# Patient Record
Sex: Male | Born: 1955
Health system: Southern US, Community
[De-identification: ages and names within clinical notes are randomized; demographics above are authoritative.]

## PROBLEM LIST (undated history)

## (undated) DIAGNOSIS — I10 Essential (primary) hypertension: Secondary | ICD-10-CM

## (undated) DIAGNOSIS — E785 Hyperlipidemia, unspecified: Secondary | ICD-10-CM

## (undated) DIAGNOSIS — I2510 Atherosclerotic heart disease of native coronary artery without angina pectoris: Secondary | ICD-10-CM

## (undated) HISTORY — PX: CARDIAC SURGERY: SHX584

## (undated) HISTORY — PX: CHOLECYSTECTOMY: SHX55

## (undated) HISTORY — PX: CORONARY ARTERY BYPASS GRAFT: SHX141

---

## 2004-03-12 HISTORY — PX: COLONOSCOPY: SHX5424

## 2004-04-19 ENCOUNTER — Ambulatory Visit: Payer: Self-pay | Admitting: Gastroenterology

## 2004-05-30 ENCOUNTER — Ambulatory Visit: Payer: Self-pay | Admitting: Gastroenterology

## 2007-02-23 ENCOUNTER — Emergency Department: Payer: Self-pay | Admitting: Emergency Medicine

## 2007-02-24 ENCOUNTER — Other Ambulatory Visit: Payer: Self-pay

## 2008-09-20 ENCOUNTER — Ambulatory Visit: Payer: Self-pay | Admitting: Unknown Physician Specialty

## 2008-09-30 ENCOUNTER — Ambulatory Visit: Payer: Self-pay | Admitting: Surgery

## 2008-10-07 ENCOUNTER — Ambulatory Visit: Payer: Self-pay | Admitting: Surgery

## 2012-03-17 ENCOUNTER — Ambulatory Visit: Payer: Self-pay | Admitting: Family Medicine

## 2013-10-14 DIAGNOSIS — E785 Hyperlipidemia, unspecified: Secondary | ICD-10-CM | POA: Insufficient documentation

## 2013-10-14 DIAGNOSIS — I25810 Atherosclerosis of coronary artery bypass graft(s) without angina pectoris: Secondary | ICD-10-CM | POA: Insufficient documentation

## 2013-10-14 DIAGNOSIS — I10 Essential (primary) hypertension: Secondary | ICD-10-CM | POA: Insufficient documentation

## 2013-11-06 DIAGNOSIS — Z951 Presence of aortocoronary bypass graft: Secondary | ICD-10-CM | POA: Insufficient documentation

## 2014-11-30 ENCOUNTER — Ambulatory Visit (INDEPENDENT_AMBULATORY_CARE_PROVIDER_SITE_OTHER): Payer: 59 | Admitting: Family Medicine

## 2014-11-30 ENCOUNTER — Encounter: Payer: Self-pay | Admitting: Family Medicine

## 2014-11-30 VITALS — BP 120/88 | HR 72 | Ht 73.0 in | Wt 243.0 lb

## 2014-11-30 DIAGNOSIS — Z0000 Encounter for general adult medical examination without abnormal findings: Secondary | ICD-10-CM | POA: Diagnosis not present

## 2014-11-30 DIAGNOSIS — Z951 Presence of aortocoronary bypass graft: Secondary | ICD-10-CM

## 2014-11-30 DIAGNOSIS — E785 Hyperlipidemia, unspecified: Secondary | ICD-10-CM

## 2014-11-30 DIAGNOSIS — I25810 Atherosclerosis of coronary artery bypass graft(s) without angina pectoris: Secondary | ICD-10-CM

## 2014-11-30 DIAGNOSIS — Z23 Encounter for immunization: Secondary | ICD-10-CM

## 2014-11-30 DIAGNOSIS — Z125 Encounter for screening for malignant neoplasm of prostate: Secondary | ICD-10-CM

## 2014-11-30 DIAGNOSIS — I10 Essential (primary) hypertension: Secondary | ICD-10-CM | POA: Diagnosis not present

## 2014-11-30 DIAGNOSIS — Z1211 Encounter for screening for malignant neoplasm of colon: Secondary | ICD-10-CM | POA: Diagnosis not present

## 2014-11-30 LAB — HEMOCCULT GUIAC POC 1CARD (OFFICE): FECAL OCCULT BLD: NEGATIVE

## 2014-11-30 NOTE — Progress Notes (Signed)
Name: Dustin Arnold   MRN: [id number]    DOB: 1955/11/13   Date:11/30/2014       Progress Note  Subjective  Chief Complaint  Chief Complaint  Patient presents with  . Annual Exam    needs labs    HPI Comments: Annual physical exam with no subjective/objective concerns.  Hypertension This is a chronic problem. The current episode started more than 1 year ago. The problem has been gradually improving since onset. Pertinent negatives include no anxiety, blurred vision, chest pain, headaches, malaise/fatigue, neck pain, orthopnea, palpitations, peripheral edema, PND, shortness of breath or sweats. There are no associated agents to hypertension. Risk factors for coronary artery disease include dyslipidemia and male gender. Past treatments include beta blockers. The current treatment provides moderate improvement. There are no compliance problems.  There is no history of angina, kidney disease, CAD/MI, CVA, heart failure, left ventricular hypertrophy, PVD, renovascular disease or retinopathy. There is no history of chronic renal disease.  Hyperlipidemia This is a recurrent problem. The current episode started more than 1 month ago. He has no history of chronic renal disease, diabetes, hypothyroidism, liver disease, obesity or nephrotic syndrome. Pertinent negatives include no chest pain, focal sensory loss, focal weakness, leg pain, myalgias or shortness of breath. Current antihyperlipidemic treatment includes statins. The current treatment provides mild improvement of lipids. There are no compliance problems.     No problem-specific assessment & plan notes found for this encounter.   No past medical history on file.  Past Surgical History  Procedure Laterality Date  . Cardiac surgery      bypass  . Cholecystectomy    . Colonoscopy  2006    cleared for [facility]    No family history on file.  Social History   Social History  . Marital Status: Married    Spouse Name: N/A  . Number of  Children: N/A  . Years of Education: N/A   Occupational History  . Not on file.   Social History Main Topics  . Smoking status: Never Smoker   . Smokeless tobacco: Not on file  . Alcohol Use: 0.0 oz/week    0 Standard drinks or equivalent per week  . Drug Use: No  . Sexual Activity: Not on file   Other Topics Concern  . Not on file   Social History Narrative  . No narrative on file    Allergies  Allergen Reactions  . Other Other (See Comments)    IVP dye     Review of Systems  Constitutional: Negative for fever, chills, weight loss and malaise/fatigue.  HENT: Negative for ear discharge, ear pain and sore throat.   Eyes: Negative for blurred vision.  Respiratory: Negative for cough, sputum production, shortness of breath and wheezing.   Cardiovascular: Negative for chest pain, palpitations, orthopnea, leg swelling and PND.  Gastrointestinal: Negative for heartburn, nausea, abdominal pain, diarrhea, constipation, blood in stool and melena.  Genitourinary: Negative for dysuria, urgency, frequency and hematuria.  Musculoskeletal: Negative for myalgias, back pain, joint pain and neck pain.  Skin: Negative for rash.  Neurological: Negative for dizziness, tingling, sensory change, focal weakness and headaches.  Endo/Heme/Allergies: Negative for environmental allergies and polydipsia. Does not bruise/bleed easily.  Psychiatric/Behavioral: Negative for depression and suicidal ideas. The patient is not nervous/anxious and does not have insomnia.      Objective  Filed Vitals:   11/30/14 0845  BP: 120/88  Pulse: 72  Height:  (1.854 m)  Weight: 243 lb (110.224 kg)  Physical Exam  Constitutional: He is oriented to person, place, and time and well-developed, well-nourished, and in no distress.  HENT:  Head: Normocephalic.  Right Ear: External ear normal.  Left Ear: External ear normal.  Nose: Nose normal.  Mouth/Throat: Oropharynx is clear and moist.  Eyes:  Conjunctivae and EOM are normal. Pupils are equal, round, and reactive to light. Right eye exhibits no discharge. Left eye exhibits no discharge. No scleral icterus.  Neck: Normal range of motion. Neck supple. No JVD present. No tracheal deviation present. No thyromegaly present.  Cardiovascular: Normal rate, regular rhythm, normal heart sounds and intact distal pulses.  Exam reveals no gallop and no friction rub.   No murmur heard. Pulmonary/Chest: Breath sounds normal. No respiratory distress. He has no wheezes. He has no rales.  Abdominal: Soft. Bowel sounds are normal. He exhibits no mass. There is no hepatosplenomegaly. There is no tenderness. There is no rebound, no guarding and no CVA tenderness.  Genitourinary: Rectum normal, prostate normal and testes/scrotum normal. Guaiac negative stool.  Musculoskeletal: Normal range of motion. He exhibits no edema or tenderness.  Lymphadenopathy:    He has no cervical adenopathy.  Neurological: He is alert and oriented to person, place, and time. He has normal sensation, normal strength, normal reflexes and intact cranial nerves. No cranial nerve deficit.  Skin: Skin is warm. No rash noted.  Psychiatric: Mood and affect normal.      Assessment & Plan  Problem List Items Addressed This Visit      Cardiovascular and Mediastinum   Arteriosclerosis of autologous vein coronary artery bypass graft   Relevant Medications   aspirin 325 MG tablet   atorvastatin (LIPITOR) 80 MG tablet   metoprolol succinate (TOPROL-XL) 50 MG 24 hr tablet   Other Relevant Orders   Lipid Profile   Benign essential HTN   Relevant Medications   aspirin 325 MG tablet   atorvastatin (LIPITOR) 80 MG tablet   metoprolol succinate (TOPROL-XL) 50 MG 24 hr tablet   Other Relevant Orders   Renal Function Panel     Other   HLD (hyperlipidemia)   Relevant Medications   aspirin 325 MG tablet   atorvastatin (LIPITOR) 80 MG tablet   metoprolol succinate (TOPROL-XL) 50 MG  24 hr tablet   Other Relevant Orders   Lipid Profile   H/O coronary artery bypass surgery   Relevant Orders   Renal Function Panel   Prostate cancer screening   Relevant Orders   PSA    Other Visit Diagnoses    Annual physical exam    -  Primary    Relevant Orders    POCT Occult Blood Stool (Completed)    Colon cancer screening        Relevant Orders    Ambulatory referral to Gastroenterology    Need for pneumococcal vaccination        Relevant Orders    Pneumococcal polysaccharide vaccine 23-valent greater than or equal to 2yo subcutaneous/IM (Completed)    Need for influenza vaccination        Relevant Orders    Flu Vaccine QUAD 36+ mos PF IM (Fluarix & Fluzone Quad PF) (Completed)         Dr. Hayden [facility]  11/30/2014

## 2014-12-01 LAB — LIPID PANEL
CHOLESTEROL TOTAL: 150 mg/dL (ref 100–199)
Chol/HDL Ratio: 2.7 ratio units (ref 0.0–5.0)
HDL: 56 mg/dL (ref >39)
LDL Calculated: 78 mg/dL (ref 0–99)
Triglycerides: 79 mg/dL (ref 0–149)
VLDL Cholesterol Cal: 16 mg/dL (ref 5–40)

## 2014-12-01 LAB — RENAL FUNCTION PANEL
ALBUMIN: 4.3 g/dL (ref 3.6–4.8)
BUN/Creatinine Ratio: 11 (ref 10–22)
BUN: 12 mg/dL (ref 8–27)
CALCIUM: 9.2 mg/dL (ref 8.6–10.2)
CHLORIDE: 100 mmol/L (ref 97–108)
CO2: 25 mmol/L (ref 18–29)
Creatinine, Ser: 1.05 mg/dL (ref 0.76–1.27)
GFR calc Af Amer: 89 mL/min/{1.73_m2} (ref >59)
GFR calc non Af Amer: 77 mL/min/{1.73_m2} (ref >59)
Glucose: 83 mg/dL (ref 65–99)
POTASSIUM: 4.7 mmol/L (ref 3.5–5.2)
Phosphorus: 3.3 mg/dL (ref 2.5–4.5)
Sodium: 141 mmol/L (ref 134–144)

## 2014-12-01 LAB — PSA: Prostate Specific Ag, Serum: 0.4 ng/mL (ref 0.0–4.0)

## 2015-01-06 ENCOUNTER — Encounter: Payer: Self-pay | Admitting: *Deleted

## 2015-01-07 ENCOUNTER — Ambulatory Visit: Payer: 59 | Admitting: Anesthesiology

## 2015-01-07 ENCOUNTER — Ambulatory Visit
Admission: RE | Admit: 2015-01-07 | Discharge: 2015-01-07 | Disposition: A | Discharge status: Home / Self Care | Payer: 59 | Source: Ambulatory Visit | Attending: Gastroenterology | Admitting: Gastroenterology

## 2015-01-07 ENCOUNTER — Encounter
Admission: RE | Disposition: A | Discharge status: Home / Self Care | Payer: Self-pay | Source: Ambulatory Visit | Attending: Gastroenterology

## 2015-01-07 ENCOUNTER — Encounter: Payer: Self-pay | Admitting: Anesthesiology

## 2015-01-07 DIAGNOSIS — Z8249 Family history of ischemic heart disease and other diseases of the circulatory system: Secondary | ICD-10-CM | POA: Insufficient documentation

## 2015-01-07 DIAGNOSIS — Z1211 Encounter for screening for malignant neoplasm of colon: Secondary | ICD-10-CM | POA: Insufficient documentation

## 2015-01-07 DIAGNOSIS — Z951 Presence of aortocoronary bypass graft: Secondary | ICD-10-CM | POA: Insufficient documentation

## 2015-01-07 DIAGNOSIS — I2510 Atherosclerotic heart disease of native coronary artery without angina pectoris: Secondary | ICD-10-CM | POA: Diagnosis not present

## 2015-01-07 DIAGNOSIS — Z7982 Long term (current) use of aspirin: Secondary | ICD-10-CM | POA: Insufficient documentation

## 2015-01-07 DIAGNOSIS — I10 Essential (primary) hypertension: Secondary | ICD-10-CM | POA: Insufficient documentation

## 2015-01-07 DIAGNOSIS — K5730 Diverticulosis of large intestine without perforation or abscess without bleeding: Secondary | ICD-10-CM | POA: Insufficient documentation

## 2015-01-07 DIAGNOSIS — Z79899 Other long term (current) drug therapy: Secondary | ICD-10-CM | POA: Insufficient documentation

## 2015-01-07 DIAGNOSIS — Z8371 Family history of colonic polyps: Secondary | ICD-10-CM | POA: Insufficient documentation

## 2015-01-07 DIAGNOSIS — E785 Hyperlipidemia, unspecified: Secondary | ICD-10-CM | POA: Diagnosis not present

## 2015-01-07 HISTORY — PX: COLONOSCOPY WITH PROPOFOL: SHX5780

## 2015-01-07 HISTORY — DX: Atherosclerotic heart disease of native coronary artery without angina pectoris: I25.10

## 2015-01-07 HISTORY — DX: Hyperlipidemia, unspecified: E78.5

## 2015-01-07 HISTORY — DX: Essential (primary) hypertension: I10

## 2015-01-07 SURGERY — COLONOSCOPY WITH PROPOFOL
Anesthesia: General

## 2015-01-07 MED ORDER — LIDOCAINE HCL (CARDIAC) 20 MG/ML IV SOLN
INTRAVENOUS | Status: DC | PRN
  Administered 2015-01-07: 20 mg via INTRAVENOUS

## 2015-01-07 MED ORDER — PROPOFOL 500 MG/50ML IV EMUL
INTRAVENOUS | Status: DC | PRN
Start: 2015-01-07 — End: 2015-01-07
  Administered 2015-01-07: 125 ug/kg/min via INTRAVENOUS

## 2015-01-07 MED ORDER — SODIUM CHLORIDE 0.9 % IV SOLN: INTRAVENOUS | Status: DC

## 2015-01-07 MED ORDER — PROPOFOL 10 MG/ML IV BOLUS
INTRAVENOUS | Status: DC | PRN
  Administered 2015-01-07: 30 mg via INTRAVENOUS
  Administered 2015-01-07: 50 mg via INTRAVENOUS
  Administered 2015-01-07: 120 mg via INTRAVENOUS
  Administered 2015-01-07: 50 mg via INTRAVENOUS

## 2015-01-07 MED ORDER — SODIUM CHLORIDE 0.9 % IV SOLN
INTRAVENOUS | Status: DC
  Administered 2015-01-07: 1000 mL via INTRAVENOUS

## 2015-01-07 MED ORDER — METOPROLOL SUCCINATE ER 50 MG PO TB24
ORAL_TABLET | ORAL | Status: AC
Start: 2015-01-07 — End: 2015-01-07
  Administered 2015-01-07: 50 mg
  Filled 2015-01-07: qty 1

## 2015-01-07 NOTE — H&P (Signed)
    Primary Care Physician:  Elizabeth Sauereanna Jones, MD Primary Gastroenterologist:  Dr. Bluford Kaufmannh  Pre-Procedure History & Physical: HPI:  Dustin Arnold is a 59 y.o. male is here for an colonoscopy.  Past Medical History  Diagnosis Date  . Coronary artery disease   . Hyperlipemia   . Hypertension     Past Surgical History  Procedure Laterality Date  . Cardiac surgery      bypass  . Cholecystectomy    . [facility]  [facility]  . Coronary artery bypass graft      Prior to Admission medications   Medication Sig Start Date End Date Taking? Authorizing Provider  aspirin 325 MG tablet Take 1 tablet by mouth daily.   Yes Historical Provider, MD  atorvastatin (LIPITOR) 80 MG tablet Take 1 tablet by mouth daily. 11/05/14  Yes Historical Provider, MD  metoprolol succinate (TOPROL-XL) 50 MG 24 hr tablet Take 1 tablet by mouth daily. 08/25/14  Yes Historical Provider, MD    Allergies as of 12/14/2014 - Review Complete 11/30/2014  Allergen Reaction Noted  . Other Other (See Comments) 11/30/2014    History reviewed. No pertinent family history.  Social History   Social History  . Marital Status: Married    Spouse Name: N/A  . Number of Children: N/A  . Years of Education: N/A   Occupational History  . Not on file.   Social History Main Topics  . Smoking status: Never Smoker   . Smokeless tobacco: Never Used  . Alcohol Use: 0.0 oz/week    0 Standard drinks or equivalent per week  . Drug Use: No  . Sexual Activity: Not on file   Other Topics Concern  . Not on file   Social History Narrative    Review of Systems: See HPI, otherwise negative ROS  Physical Exam: BP 125/82 mmHg  Pulse 75  Temp(Src) 96.3 F (35.7 C) (Tympanic)  Resp 16  Ht 6\' 1"  (1.854 m)  Wt 111.131 kg (245 lb)  BMI 32.33 kg/m2  SpO2 99% General:   Alert,  pleasant and cooperative in NAD Head:  Normocephalic and atraumatic. Neck:  Supple; no masses or thyromegaly. Lungs:  Clear throughout to  auscultation.    Heart:  Regular rate and rhythm. Abdomen:  Soft, nontender and nondistended. Normal bowel sounds, without guarding, and without rebound.   Neurologic:  Alert and  oriented x4;  grossly normal neurologically.  Impression/Plan: Dustin Arnold is here for an colonoscopy to be performed for screening.  Risks, benefits, limitations, and alternatives regarding colonoscopy have been reviewed with the patient.  Questions have been answered.  All parties agreeable.   Vidya Bamford, Ezzard StandingPAUL Y, MD  01/07/2015, 9:48 AM

## 2015-01-07 NOTE — Op Note (Signed)
[facility] Gastroenterology Patient Name: Dustin Arnold Procedure Date: 01/07/2015 9:53 AM MRN: [id number] Account #: [id number] Date of Birth: 1956/02/03 Admit Type: Outpatient Age: 59 Room: [facility] ENDO ROOM 4 Gender: Male Note Status: Finalized Procedure:         Colonoscopy Indications:       Colon cancer screening in patient at increased risk:                     Family history of colon polyps Providers:         Ezzard Standing. Bluford Kaufmann, MD Referring MD:      Duanne Limerick, MD (Referring MD) Medicines:         Monitored Anesthesia Care Complications:     No immediate complications. Procedure:         Pre-Anesthesia Assessment:                    - Prior to the procedure, a History and Physical was                     performed, and patient medications, allergies and                     sensitivities were reviewed. The patient's tolerance of                     previous anesthesia was reviewed.                    - The risks and benefits of the procedure and the sedation                     options and risks were discussed with the patient. All                     questions were answered and informed consent was obtained.                    - After reviewing the risks and benefits, the patient was                     deemed in satisfactory condition to undergo the procedure.                    After obtaining informed consent, the colonoscope was                     passed under direct vision. Throughout the procedure, the                     patient's blood pressure, pulse, and oxygen saturations                     were monitored continuously. The Colonoscope was                     introduced through the anus and advanced to the the cecum,                     identified by appendiceal orifice and ileocecal valve. The                     colonoscopy was performed without difficulty. The patient  tolerated the procedure well. The quality of the bowel                   preparation was good. Findings:      A few small and large-mouthed diverticula were found in the sigmoid       colon.      The exam was otherwise without abnormality. Impression:        - Diverticulosis in the sigmoid colon.                    - The examination was otherwise normal.                    - No specimens collected. Recommendation:    - Discharge patient to home.                    - Repeat colonoscopy in 5 years for surveillance.                    - Discharge patient to home.                    - The findings and recommendations were discussed with the                     patient. Procedure Code(s): --- Professional ---                    [phone], Colonoscopy, flexible; diagnostic, including                     collection of specimen(s) by brushing or washing, when                     performed (separate procedure) Diagnosis Code(s): --- Professional ---                    Z83.71, Family history of colonic polyps                    K57.30, Diverticulosis of large intestine without                     perforation or abscess without bleeding CPT copyright 2014 American Medical Association. All rights reserved. The codes documented in this report are preliminary and upon coder review may  be revised to meet current compliance requirements. Wallace CullensPaul Y Jann Ra, MD 01/07/2015 10:12:56 AM This report has been signed electronically. Number of Addenda: 0 Note Initiated On: 01/07/2015 9:53 AM Scope Withdrawal Time: 0 hours 9 minutes 50 seconds  Total Procedure Duration: 0 hours 13 minutes 31 seconds       [facility]

## 2015-01-07 NOTE — Anesthesia Preprocedure Evaluation (Signed)
Anesthesia Evaluation  Patient identified by MRN, date of birth, ID band Patient awake    Reviewed: Allergy & Precautions, H&P , NPO status , Patient's Chart, lab work & pertinent test results, reviewed documented beta blocker date and time   History of Anesthesia Complications Negative for: history of anesthetic complications  Airway Mallampati: III  TM Distance: >3 FB Neck ROM: full    Dental no notable dental hx. (+) Poor Dentition   Pulmonary neg pulmonary ROS,    Pulmonary exam normal breath sounds clear to auscultation       Cardiovascular Exercise Tolerance: Good hypertension, On Medications and On Home Beta Blockers (-) angina+ CAD and + CABG (2 vessel in 1995)  (-) Past MI and (-) Cardiac Stents Normal cardiovascular exam+ dysrhythmias (-) Valvular Problems/Murmurs Rhythm:regular Rate:Normal     Neuro/Psych negative neurological ROS  negative psych ROS   GI/Hepatic negative GI ROS, Neg liver ROS,   Endo/Other  negative endocrine ROS  Renal/GU negative Renal ROS  negative genitourinary   Musculoskeletal   Abdominal   Peds  Hematology negative hematology ROS (+)   Anesthesia Other Findings Past Medical History:   Coronary artery disease                                      Hyperlipemia                                                 Hypertension                                                 Reproductive/Obstetrics negative OB ROS                             Anesthesia Physical Anesthesia Plan  ASA: III  Anesthesia Plan: General   Post-op Pain Management:    Induction:   Airway Management Planned:   Additional Equipment:   Intra-op Plan:   Post-operative Plan:   Informed Consent: I have reviewed the patients History and Physical, chart, labs and discussed the procedure including the risks, benefits and alternatives for the proposed anesthesia with the patient or  authorized representative who has indicated his/her understanding and acceptance.   Dental Advisory Given  Plan Discussed with: Anesthesiologist, CRNA and Surgeon  Anesthesia Plan Comments:         Anesthesia Quick Evaluation

## 2015-01-07 NOTE — Transfer of Care (Signed)
Immediate Anesthesia Transfer of Care Note  Patient: Dustin Arnold  Procedure(s) Performed: Procedure(s): COLONOSCOPY WITH PROPOFOL (N/A)  Patient Location: Endoscopy Unit  Anesthesia Type:General  Level of Consciousness: sedated  Airway & Oxygen Therapy: Patient Spontanous Breathing and Patient connected to nasal cannula oxygen  Post-op Assessment: Report given to RN and Post -op Vital signs reviewed and stable  Post vital signs: Reviewed and stable  Last Vitals:  Filed Vitals:   01/07/15 1014  BP:   Pulse:   Temp: 36.1 C  Resp:     Complications: No apparent anesthesia complications

## 2015-01-09 ENCOUNTER — Encounter: Payer: Self-pay | Admitting: Gastroenterology

## 2015-01-10 NOTE — Anesthesia Postprocedure Evaluation (Signed)
  Anesthesia Post-op Note  Patient: Dustin Arnold  Procedure(s) Performed: Procedure(s): COLONOSCOPY WITH PROPOFOL (N/A)  Anesthesia type:General  Patient location: PACU  Post pain: Pain level controlled  Post assessment: Post-op Vital signs reviewed, Patient's Cardiovascular Status Stable, Respiratory Function Stable, Patent Airway and No signs of Nausea or vomiting  Post vital signs: Reviewed and stable  Last Vitals:  Filed Vitals:   01/07/15 1040  BP: 122/87  Pulse: 62  Temp:   Resp: 16    Level of consciousness: awake, alert  and patient cooperative  Complications: No apparent anesthesia complications

## 2015-11-10 ENCOUNTER — Ambulatory Visit (INDEPENDENT_AMBULATORY_CARE_PROVIDER_SITE_OTHER): Payer: 59 | Admitting: Family Medicine

## 2015-11-10 ENCOUNTER — Encounter: Payer: Self-pay | Admitting: Family Medicine

## 2015-11-10 VITALS — BP 120/80 | HR 68 | Ht 73.0 in | Wt 225.0 lb

## 2015-11-10 DIAGNOSIS — J40 Bronchitis, not specified as acute or chronic: Secondary | ICD-10-CM | POA: Diagnosis not present

## 2015-11-10 DIAGNOSIS — J0100 Acute maxillary sinusitis, unspecified: Secondary | ICD-10-CM

## 2015-11-10 MED ORDER — GUAIFENESIN-CODEINE 100-10 MG/5ML PO SYRP: 5.0000 mL | ORAL_SOLUTION | Freq: Three times a day (TID) | ORAL | 0 refills | Status: DC | PRN

## 2015-11-10 MED ORDER — DOXYCYCLINE HYCLATE 100 MG PO TABS: 100.0000 mg | ORAL_TABLET | Freq: Two times a day (BID) | ORAL | 0 refills | Status: DC

## 2015-11-10 NOTE — Progress Notes (Signed)
Name: Dustin Arnold   MRN: [id number]    DOB: [birth year]   Date:11/10/2015       Progress Note  Subjective  Chief Complaint  Chief Complaint  Patient presents with  . Sinusitis    cough- hacking/ dry, headache and eyes were "glued together this morning"    Sinusitis  This is a new problem. The current episode started in the past 7 days. The problem has been gradually worsening since onset. The fever has been present for less than 1 day. The pain is mild. Associated symptoms include congestion, coughing, headaches, shortness of breath, sinus pressure and a sore throat. Pertinent negatives include no chills, diaphoresis, ear pain, hoarse voice, neck pain, sneezing or swollen glands. Past treatments include oral decongestants (mucinex). The treatment provided mild relief.  Cough  This is a new problem. The problem has been gradually worsening. The problem occurs every few minutes. The cough is productive of purulent sputum. Associated symptoms include a fever, headaches, nasal congestion, postnasal drip, a sore throat and shortness of breath. Pertinent negatives include no chest pain, chills, ear pain, heartburn, myalgias, rash, weight loss or wheezing. The symptoms are aggravated by exercise. There is no history of environmental allergies.    No problem-specific Assessment & Plan notes found for this encounter.   Past Medical History:  Diagnosis Date  . Coronary artery disease   . Hyperlipemia   . Hypertension     Past Surgical History:  Procedure Laterality Date  . CARDIAC SURGERY     bypass  . CHOLECYSTECTOMY    . COLONOSCOPY  2006   cleared for [facility]  . COLONOSCOPY WITH PROPOFOL N/A 01/07/2015   Procedure: COLONOSCOPY WITH PROPOFOL;  Surgeon: Wallace Cullens, MD;  Location[facility] ENDOSCOPY;  Service: Gastroenterology;  Laterality: N/A;  . CORONARY ARTERY BYPASS GRAFT      History reviewed. No pertinent family history.  Social History   Social History  . Marital status: Married   Spouse name: N/A  . Number of children: N/A  . Years of education: N/A   Occupational History  . Not on file.   Social History Main Topics  . Smoking status: Never Smoker  . Smokeless tobacco: Never Used  . Alcohol use 0.0 oz/week  . Drug use: No  . Sexual activity: Not Currently   Other Topics Concern  . Not on file   Social History Narrative  . No narrative on file    Allergies  Allergen Reactions  . Other Other (See Comments)    IVP dye     Review of Systems  Constitutional: Positive for fever. Negative for chills, diaphoresis, malaise/fatigue and weight loss.  HENT: Positive for congestion, postnasal drip, sinus pressure and sore throat. Negative for ear discharge, ear pain, hoarse voice and sneezing.   Eyes: Negative for blurred vision.  Respiratory: Positive for cough and shortness of breath. Negative for sputum production and wheezing.   Cardiovascular: Negative for chest pain, palpitations and leg swelling.  Gastrointestinal: Negative for abdominal pain, blood in stool, constipation, diarrhea, heartburn, melena and nausea.  Genitourinary: Negative for dysuria, frequency, hematuria and urgency.  Musculoskeletal: Negative for back pain, joint pain, myalgias and neck pain.  Skin: Negative for rash.  Neurological: Positive for headaches. Negative for dizziness, tingling, sensory change and focal weakness.  Endo/Heme/Allergies: Negative for environmental allergies and polydipsia. Does not bruise/bleed easily.  Psychiatric/Behavioral: Negative for depression and suicidal ideas. The patient is not nervous/anxious and does not have insomnia.  Objective  Vitals:   11/10/15 0803  BP: 120/80  Pulse: 68  Weight: 225 lb (102.1 kg)  Height: 6\' 1"  (1.854 m)    Physical Exam  Constitutional: He is oriented to person, place, and time and well-developed, well-nourished, and in no distress.  HENT:  Head: Normocephalic.  Right Ear: External ear normal.  Left Ear:  External ear normal.  Nose: Nose normal.  Mouth/Throat: Oropharynx is clear and moist.  Eyes: Conjunctivae and EOM are normal. Pupils are equal, round, and reactive to light. Right eye exhibits no discharge. Left eye exhibits no discharge. No scleral icterus.  Neck: Normal range of motion. Neck supple. No JVD present. No tracheal deviation present. No thyromegaly present.  Cardiovascular: Normal rate, regular rhythm, normal heart sounds and intact distal pulses.  Exam reveals no gallop and no friction rub.   No murmur heard. Pulmonary/Chest: Breath sounds normal. No respiratory distress. He has no wheezes. He has no rales.  Abdominal: Soft. Bowel sounds are normal. He exhibits no mass. There is no hepatosplenomegaly. There is no tenderness. There is no rebound, no guarding and no CVA tenderness.  Musculoskeletal: Normal range of motion. He exhibits no edema or tenderness.  Lymphadenopathy:    He has no cervical adenopathy.  Neurological: He is alert and oriented to person, place, and time. He has normal sensation, normal strength, normal reflexes and intact cranial nerves. No cranial nerve deficit.  Skin: Skin is warm. No rash noted.  Psychiatric: Mood and affect normal.  Nursing note and vitals reviewed.     Assessment & Plan  Problem List Items Addressed This Visit    None    Visit Diagnoses    Acute maxillary sinusitis, recurrence not specified    -  Primary   Relevant Medications   doxycycline (VIBRA-TABS) 100 MG tablet   guaiFENesin-codeine (ROBITUSSIN AC) 100-10 MG/5ML syrup   Bronchitis       Relevant Medications   doxycycline (VIBRA-TABS) 100 MG tablet   guaiFENesin-codeine (ROBITUSSIN AC) 100-10 MG/5ML syrup        Dr. Hayden Rasmusseneanna Charlise Giovanetti [facility]  11/10/15

## 2015-12-02 ENCOUNTER — Encounter: Payer: Self-pay | Admitting: Family Medicine

## 2015-12-02 ENCOUNTER — Ambulatory Visit (INDEPENDENT_AMBULATORY_CARE_PROVIDER_SITE_OTHER): Payer: 59 | Admitting: Family Medicine

## 2015-12-02 VITALS — BP 120/70 | HR 72 | Ht 73.0 in | Wt 223.0 lb

## 2015-12-02 DIAGNOSIS — Z0000 Encounter for general adult medical examination without abnormal findings: Secondary | ICD-10-CM | POA: Diagnosis not present

## 2015-12-02 NOTE — Progress Notes (Signed)
Name: Dustin Arnold   MRN: [id number]    DOB: 1955/08/29   Date:12/02/2015       Progress Note  Subjective  Chief Complaint  Chief Complaint  Patient presents with  . Annual Exam    glucose and chol drawn    Patient presents for physical for employment.    No problem-specific Assessment & Plan notes found for this encounter.   Past Medical History:  Diagnosis Date  . Coronary artery disease   . Hyperlipemia   . Hypertension     Past Surgical History:  Procedure Laterality Date  . CARDIAC SURGERY     bypass  . CHOLECYSTECTOMY    . COLONOSCOPY  2006   cleared for [facility]  . COLONOSCOPY WITH PROPOFOL N/A 01/07/2015   Procedure: COLONOSCOPY WITH PROPOFOL;  Surgeon: Wallace Cullens, MD;  Location: [facility] ENDOSCOPY;  Service: Gastroenterology;  Laterality: N/A;  . CORONARY ARTERY BYPASS GRAFT      History reviewed. No pertinent family history.  Social History   Social History  . Marital status: Married    Spouse name: N/A  . Number of children: N/A  . Years of education: N/A   Occupational History  . Not on file.   Social History Main Topics  . Smoking status: Never Smoker  . Smokeless tobacco: Never Used  . Alcohol use 0.0 oz/week  . Drug use: No  . Sexual activity: Not Currently   Other Topics Concern  . Not on file   Social History Narrative  . No narrative on file    Allergies  Allergen Reactions  . Other Other (See Comments)    IVP dye     Review of Systems  Constitutional: Negative for chills, fever, malaise/fatigue and weight loss.  HENT: Negative for ear discharge, ear pain and sore throat.   Eyes: Negative for blurred vision.  Respiratory: Negative for cough, sputum production, shortness of breath and wheezing.   Cardiovascular: Negative for chest pain, palpitations and leg swelling.  Gastrointestinal: Negative for abdominal pain, blood in stool, constipation, diarrhea, heartburn, melena and nausea.  Genitourinary: Negative for dysuria, frequency,  hematuria and urgency.  Musculoskeletal: Negative for back pain, joint pain, myalgias and neck pain.  Skin: Negative for rash.  Neurological: Negative for dizziness, tingling, sensory change, focal weakness and headaches.  Endo/Heme/Allergies: Negative for environmental allergies and polydipsia. Does not bruise/bleed easily.  Psychiatric/Behavioral: Negative for depression and suicidal ideas. The patient is not nervous/anxious and does not have insomnia.      Objective  Vitals:   12/02/15 0834  BP: 120/70  Pulse: 72  Weight: 223 lb (101.2 kg)  Height: 6\' 1"  (1.854 m)    Physical Exam  Constitutional: He is oriented to person, place, and time and well-developed, well-nourished, and in no distress.  HENT:  Head: Normocephalic.  Right Ear: Tympanic membrane, external ear and ear canal normal.  Left Ear: Tympanic membrane, external ear and ear canal normal.  Nose: Nose normal.  Mouth/Throat: Oropharynx is clear and moist. No oropharyngeal exudate, posterior oropharyngeal edema or posterior oropharyngeal erythema.  Eyes: Conjunctivae and EOM are normal. Pupils are equal, round, and reactive to light. Right eye exhibits no discharge. Left eye exhibits no discharge. No scleral icterus.  Neck: Trachea normal and normal range of motion. Neck supple. No JVD present. No tracheal deviation present. No thyromegaly present.  Cardiovascular: Normal rate, regular rhythm, S1 normal, S2 normal, normal heart sounds and intact distal pulses.  Exam reveals no gallop, no S3, no  S4 and no friction rub.   No murmur heard. Pulmonary/Chest: Breath sounds normal. No respiratory distress. He has no wheezes. He has no rales. Right breast exhibits no mass. Left breast exhibits no mass.  Abdominal: Soft. Bowel sounds are normal. He exhibits no mass. There is no hepatosplenomegaly. There is no tenderness. There is no rebound, no guarding and no CVA tenderness.  Genitourinary: Rectum normal, prostate normal,  testes/scrotum normal and penis normal. Rectal exam shows guaiac negative stool.  Musculoskeletal: Normal range of motion. He exhibits no edema or tenderness.  Lymphadenopathy:    He has no cervical adenopathy.  Neurological: He is alert and oriented to person, place, and time. He has normal sensation, normal strength, normal reflexes and intact cranial nerves. No cranial nerve deficit.  Skin: Skin is warm. No rash noted.  Psychiatric: Mood and affect normal.  Nursing note and vitals reviewed.     Assessment & Plan  Problem List Items Addressed This Visit    None    Visit Diagnoses    Annual physical exam    -  Primary   Relevant Orders   Lipid Profile   Glucose        Dr. Elizabeth Sauereanna Jones [facility]  12/02/15

## 2015-12-03 LAB — LIPID PANEL
CHOLESTEROL TOTAL: 128 mg/dL (ref 100–199)
Chol/HDL Ratio: 2.2 ratio units (ref 0.0–5.0)
HDL: 59 mg/dL (ref >39)
LDL CALC: 57 mg/dL (ref 0–99)
TRIGLYCERIDES: 58 mg/dL (ref 0–149)
VLDL CHOLESTEROL CAL: 12 mg/dL (ref 5–40)

## 2015-12-03 LAB — GLUCOSE, RANDOM: Glucose: 85 mg/dL (ref 65–99)

## 2017-05-18 ENCOUNTER — Ambulatory Visit
Admission: EM | Admit: 2017-05-18 | Discharge: 2017-05-18 | Disposition: A | Discharge status: Home / Self Care | Payer: 59 | Attending: Family Medicine | Admitting: Family Medicine

## 2017-05-18 ENCOUNTER — Other Ambulatory Visit: Payer: Self-pay

## 2017-05-18 DIAGNOSIS — J069 Acute upper respiratory infection, unspecified: Secondary | ICD-10-CM | POA: Diagnosis not present

## 2017-05-18 MED ORDER — FLUTICASONE PROPIONATE 50 MCG/ACT NA SUSP: 2.0000 | Freq: Every day | NASAL | 0 refills | Status: DC

## 2017-05-18 MED ORDER — BENZONATATE 200 MG PO CAPS: ORAL_CAPSULE | ORAL | 0 refills | Status: DC

## 2017-05-18 MED ORDER — HYDROCODONE-HOMATROPINE 5-1.5 MG/5ML PO SYRP: 5.0000 mL | ORAL_SOLUTION | Freq: Four times a day (QID) | ORAL | 0 refills | Status: DC | PRN

## 2017-05-18 NOTE — ED Provider Notes (Signed)
[facility]    CSN: [id number] Arrival date & time: 05/18/17  [id number]     History   Chief Complaint Chief Complaint  Patient presents with  . Cough    HPI Dustin Arnold is a 62 y.o. male.   HPI Mr. Dustin CaseyRice presents with a 3-day history of cough that is productive chance amount of mucus chest and nasal congestion.  He has had no fever or chills.  Been using Delsym at nighttime for the cough but wakens every 3-4 hours with congestion and coughing.  Non-smoker.        Past Medical History:  Diagnosis Date  . Coronary artery disease   . Hyperlipemia   . Hypertension     Patient Active Problem List   Diagnosis Date Noted  . Prostate cancer screening 11/30/2014  . H/O coronary artery bypass surgery 11/06/2013  . Arteriosclerosis of autologous vein coronary artery bypass graft 10/14/2013  . Benign essential HTN 10/14/2013  . HLD (hyperlipidemia) 10/14/2013    Past Surgical History:  Procedure Laterality Date  . CARDIAC SURGERY     bypass  . CHOLECYSTECTOMY    . [facility]COPY  2006   [facility]  . COLONOSCOPY WITH PROPOFOL N/A 01/07/2015   Procedure: COLONOSCOPY WITH PROPOFOL;  Surgeon: Wallace CullensPaul Y Oh, MD;  [facility];  Service: Gastroenterology;  Laterality: N/A;  . CORONARY ARTERY BYPASS GRAFT         Home Medications    Prior to Admission medications   Medication Sig Start Date End Date Taking? Authorizing Provider  aspirin 325 MG tablet Take 1 tablet by mouth daily.   Yes [provider]  atorvastatin (LIPITOR) 80 MG tablet Take 1 tablet by mouth daily. 11/05/14  Yes [provider]  metoprolol succinate (TOPROL-XL) 50 MG 24 hr tablet Take 1 tablet by mouth daily. 08/25/14  Yes [provider]  benzonatate (TESSALON) 200 MG capsule Take one cap TID PRN cough 05/18/17   Lutricia Feiloemer, Johnda Billiot P, PA-C  fluticasone [facility](FLONASE) 50 MCG/ACT nasal spray Place 2 sprays into both nostrils daily. 05/18/17   Lutricia Feiloemer, Chyane Greer P, PA-C    HYDROcodone-homatropine (HYCODAN) 5-1.5 MG/5ML syrup Take 5 mLs by mouth every 6 (six) hours as needed for cough. 05/18/17   Lutricia Feiloemer, Danish Ruffins P, PA-C    Family History Family History  Problem Relation Age of Onset  . Cancer Father        esophageal    Social History Social History   Tobacco Use  . Smoking status: Never Smoker  . Smokeless tobacco: Never Used  Substance Use Topics  . Alcohol use: Yes    Alcohol/week: 0.0 oz  . Drug use: No     Allergies   Other   Review of Systems Review of Systems  Constitutional: Positive for activity change and fatigue. Negative for chills and fever.  HENT: Positive for congestion and postnasal drip.   Respiratory: Positive for cough.   All other systems reviewed and are negative.    Physical Exam Triage Vital Signs ED Triage Vitals  Enc Vitals Group     BP 05/18/17 0927 119/75     Pulse Rate 05/18/17 0927 76     Resp --      Temp 05/18/17 0927 98.7 F (37.1 C)     Temp Source 05/18/17 0927 Oral     SpO2 05/18/17 0927 100 %     Weight 05/18/17 0926 230 lb (104.3 kg)     Height 05/18/17 0926 6\' 1"  (  1.854 m)     Head Circumference --      Peak Flow --      Pain Score --      Pain Loc --      Pain Edu? --      Excl. in GC? --    No data found.  Updated Vital Signs BP 119/75 (BP Location: Left Arm)   Pulse 76   Temp 98.7 F (37.1 C) (Oral)   Ht 6\' 1"  (1.854 m)   Wt 230 lb (104.3 kg)   SpO2 100%   BMI 30.34 kg/m   Visual Acuity Right Eye Distance:   Left Eye Distance:   Bilateral Distance:    Right Eye Near:   Left Eye Near:    Bilateral Near:     Physical Exam  Constitutional: He is oriented to person, place, and time. He appears well-developed and well-nourished. No distress.  HENT:  Head: Normocephalic.  Right Ear: External ear normal.  Left Ear: External ear normal.  Nose: Nose normal.  Mouth/Throat: Oropharynx is clear and moist. No oropharyngeal exudate.  Eyes: Pupils are equal, round, and  reactive to light. Right eye exhibits no discharge. Left eye exhibits no discharge.  Neck: Normal range of motion.  Pulmonary/Chest: Effort normal and breath sounds normal.  Musculoskeletal: Normal range of motion.  Lymphadenopathy:    He has no cervical adenopathy.  Neurological: He is alert and oriented to person, place, and time.  Skin: Skin is warm and dry. He is not diaphoretic.  Psychiatric: He has a normal mood and affect. His behavior is normal. Judgment and thought content normal.  Nursing note and vitals reviewed.    UC Treatments / Results  Labs (all labs ordered are listed, but only abnormal results are displayed) Labs Reviewed - No data to display  EKG  EKG Interpretation None       Radiology No results found.  Procedures Procedures (including critical care time)  Medications Ordered in UC Medications - No data to display   Initial Impression / Assessment and Plan / UC Course  I have reviewed the triage vital signs and the nursing notes.  Pertinent labs & imaging results that were available during my care of the patient were reviewed by me and considered in my medical decision making (see chart for details).     Plan: 1. Test/x-ray results and diagnosis reviewed with patient 2. rx as per orders; risks, benefits, potential side effects reviewed with patient 3. Recommend supportive treatment with rest and fluids.  Flonase on a daily basis for the next 3-4 weeks.  Treat the cough symptomatically at the present time.  This is likely a virus and not requiring antibiotics.  He has any changes or worsening he should return to our clinic or follow-up with his primary care physician 4. F/u prn if symptoms worsen or don't improve   Final Clinical Impressions(s) / UC Diagnoses   Final diagnoses:  Upper respiratory tract infection, unspecified type    ED Discharge Orders        Ordered    benzonatate (TESSALON) 200 MG capsule     05/18/17 1009     fluticasone (FLONASE) 50 MCG/ACT nasal spray  Daily     05/18/17 1009    HYDROcodone-homatropine (HYCODAN) 5-1.5 MG/5ML syrup  Every 6 hours PRN     05/18/17 1009       Controlled Substance Prescriptions [place] Controlled Substance Registry consulted? Not Applicable   Lutricia Feil, PA-C 05/18/17 1015

## 2017-05-18 NOTE — ED Triage Notes (Signed)
Patient is c/o cough and congestion x 3 days.

## 2017-11-27 ENCOUNTER — Ambulatory Visit (INDEPENDENT_AMBULATORY_CARE_PROVIDER_SITE_OTHER): Payer: 59 | Admitting: Family Medicine

## 2017-11-27 ENCOUNTER — Encounter: Payer: Self-pay | Admitting: Family Medicine

## 2017-11-27 VITALS — BP 120/80 | HR 76 | Ht 73.0 in | Wt 235.0 lb

## 2017-11-27 DIAGNOSIS — E785 Hyperlipidemia, unspecified: Secondary | ICD-10-CM | POA: Diagnosis not present

## 2017-11-27 DIAGNOSIS — Z23 Encounter for immunization: Secondary | ICD-10-CM

## 2017-11-27 DIAGNOSIS — Z1211 Encounter for screening for malignant neoplasm of colon: Secondary | ICD-10-CM

## 2017-11-27 DIAGNOSIS — Z0000 Encounter for general adult medical examination without abnormal findings: Secondary | ICD-10-CM | POA: Diagnosis not present

## 2017-11-27 DIAGNOSIS — I10 Essential (primary) hypertension: Secondary | ICD-10-CM | POA: Diagnosis not present

## 2017-11-27 LAB — HEMOCCULT GUIAC POC 1CARD (OFFICE): Fecal Occult Blood, POC: NEGATIVE

## 2017-11-27 NOTE — Assessment & Plan Note (Signed)
Followed with Dr Henriette CombsParochos. Check renal panel.

## 2017-11-27 NOTE — Assessment & Plan Note (Signed)
Followed with Dr Henriette CombsParochos. Check lipid panel.

## 2017-11-27 NOTE — Progress Notes (Signed)
Name: Dustin Arnold   MRN: [id number]    DOB: Aug 06, 1955   Date:11/27/2017       Progress Note  Subjective  Chief Complaint  Chief Complaint  Patient presents with  . Annual Exam    Patient is a 62 year old male who presents for a comprehensive physical exam. The patient reports the following problems: none. Health maintenance has been reviewed tdap/influenza.   Benign essential HTN Followed with Dr Henriette Combs. Check renal panel.  HLD (hyperlipidemia) Followed with Dr Henriette Combs. Check lipid panel.   Past Medical History:  Diagnosis Date  . Coronary artery disease   . Hyperlipemia   . Hypertension     Past Surgical History:  Procedure Laterality Date  . CARDIAC SURGERY     bypass  . CHOLECYSTECTOMY    . COLONOSCOPY  2006   cleared for [facility]  . COLONOSCOPY WITH PROPOFOL N/A 01/07/2015   Procedure: COLONOSCOPY WITH PROPOFOL;  Surgeon: Wallace Cullens, MD;  Location: [facility] ENDOSCOPY;  Service: Gastroenterology;  Laterality: N/A;  . CORONARY ARTERY BYPASS GRAFT      Family History  Problem Relation Age of Onset  . Cancer Father        esophageal    Social History   Socioeconomic History  . Marital status: Married    Spouse name: Not on file  . Number of children: Not on file  . Years of education: Not on file  . Highest education level: Not on file  Occupational History  . Not on file  Social Needs  . Financial resource strain: Not on file  . Food insecurity:    Worry: Not on file    Inability: Not on file  . Transportation needs:    Medical: Not on file    Non-medical: Not on file  Tobacco Use  . Smoking status: Never Smoker  . Smokeless tobacco: Never Used  Substance and Sexual Activity  . Alcohol use: Yes    Alcohol/week: 0.0 standard drinks  . Drug use: No  . Sexual activity: Not Currently  Lifestyle  . Physical activity:    Days per week: Not on file    Minutes per session: Not on file  . Stress: Not on file  Relationships  . Social connections:   Talks on phone: Not on file    Gets together: Not on file    Attends religious service: Not on file    Active member of club or organization: Not on file    Attends meetings of clubs or organizations: Not on file    Relationship status: Not on file  . Intimate partner violence:    Fear of current or ex partner: Not on file    Emotionally abused: Not on file    Physically abused: Not on file    Forced sexual activity: Not on file  Other Topics Concern  . Not on file  Social History Narrative  . Not on file    Allergies  Allergen Reactions  . Other Other (See Comments)    IVP dye    Outpatient Medications Prior to Visit  Medication Sig Dispense Refill  . aspirin 325 MG tablet Take 1 tablet by mouth daily.    Marland Kitchen atorvastatin (LIPITOR) 80 MG tablet Take 1 tablet by mouth daily.    . metoprolol succinate (TOPROL-XL) 50 MG 24 hr tablet Take 1 tablet by mouth daily.    . benzonatate (TESSALON) 200 MG capsule Take one cap TID PRN cough 30 capsule 0  .  fluticasone (FLONASE) 50 MCG/ACT nasal spray Place 2 sprays into both nostrils daily. 16 g 0  . HYDROcodone-homatropine (HYCODAN) 5-1.5 MG/5ML syrup Take 5 mLs by mouth every 6 (six) hours as needed for cough. 120 mL 0   No facility-administered medications prior to visit.     Review of Systems  Constitutional: Negative for chills, fever, malaise/fatigue and weight loss.  HENT: Negative for ear discharge, ear pain and sore throat.   Eyes: Negative for blurred vision.  Respiratory: Negative for cough, sputum production, shortness of breath and wheezing.   Cardiovascular: Negative for chest pain, palpitations and leg swelling.  Gastrointestinal: Negative for abdominal pain, blood in stool, constipation, diarrhea, heartburn, melena and nausea.  Genitourinary: Negative for dysuria, frequency, hematuria and urgency.  Musculoskeletal: Negative for back pain, joint pain, myalgias and neck pain.  Skin: Negative for rash.  Neurological:  Negative for dizziness, tingling, sensory change, focal weakness and headaches.  Endo/Heme/Allergies: Negative for environmental allergies and polydipsia. Does not bruise/bleed easily.  Psychiatric/Behavioral: Negative for depression and suicidal ideas. The patient is not nervous/anxious and does not have insomnia.      Objective  Vitals:   11/27/17 0842  BP: 120/80  Pulse: 76  Weight: 235 lb (106.6 kg)  Height: 6\' 1"  (1.854 m)    Physical Exam  Constitutional: He is oriented to person, place, and time. Vital signs are normal. He appears well-developed and well-nourished.  HENT:  Head: Normocephalic.  Right Ear: Hearing, tympanic membrane, external ear and ear canal normal.  Left Ear: Hearing, tympanic membrane, external ear and ear canal normal.  Nose: Nose normal. No mucosal edema.  Mouth/Throat: Uvula is midline and oropharynx is clear and moist. No oropharyngeal exudate, posterior oropharyngeal edema or posterior oropharyngeal erythema.  Eyes: Pupils are equal, round, and reactive to light. Conjunctivae, EOM and lids are normal. Right eye exhibits no discharge. Left eye exhibits no discharge. No scleral icterus.  Fundoscopic exam:      The right eye shows no arteriolar narrowing and no AV nicking.       The left eye shows no arteriolar narrowing and no AV nicking.  Neck: Normal range of motion and full passive range of motion without pain. Neck supple. Normal carotid pulses, no hepatojugular reflux and no JVD present. No tracheal tenderness present. Carotid bruit is not present. No tracheal deviation present. No thyroid mass and no thyromegaly present.  Cardiovascular: Normal rate, regular rhythm, S1 normal, S2 normal, normal heart sounds and intact distal pulses. PMI is not displaced. Exam reveals no gallop, no S3, no S4, no distant heart sounds and no friction rub.  No murmur heard.  No systolic murmur is present.  No diastolic murmur is present. Pulses:      Carotid pulses  are 2+ on the right side, and 2+ on the left side.      Radial pulses are 2+ on the right side, and 2+ on the left side.       Femoral pulses are 2+ on the right side, and 2+ on the left side.      Popliteal pulses are 2+ on the right side, and 2+ on the left side.       Dorsalis pedis pulses are 2+ on the right side, and 2+ on the left side.       Posterior tibial pulses are 2+ on the right side, and 2+ on the left side.  Pulmonary/Chest: Effort normal and breath sounds normal. No stridor. No respiratory distress. He has  no decreased breath sounds. He has no wheezes. He has no rales. He exhibits no mass. Right breast exhibits no mass. Left breast exhibits no mass.  Abdominal: Soft. Normal appearance and bowel sounds are normal. He exhibits no mass. There is no hepatosplenomegaly. There is no tenderness. There is no rebound, no guarding and no CVA tenderness.  Genitourinary: Testes normal. Right testis shows no mass. Left testis shows no mass.  Musculoskeletal: Normal range of motion. He exhibits no edema or tenderness.  Lymphadenopathy:       Head (left side): No submandibular adenopathy present.    He has no cervical adenopathy.    He has no axillary adenopathy. No inguinal adenopathy noted on the right or left side.  Neurological: He is alert and oriented to person, place, and time. He has normal strength and normal reflexes. No cranial nerve deficit or sensory deficit.  Skin: Skin is warm and intact. No rash noted. No pallor.  Nursing note and vitals reviewed.     Assessment & Plan  Problem List Items Addressed This Visit      Cardiovascular and Mediastinum   Benign essential HTN    Followed with Dr Henriette Combs. Check renal panel.      Relevant Orders   Renal Function Panel     Other   HLD (hyperlipidemia)    Followed with Dr Henriette Combs. Check lipid panel.      Relevant Orders   Lipid Panel With LDL/HDL Ratio    Other Visit Diagnoses    General medical exam    -  Primary    Subjective/objective concerns negative.   Relevant Orders   Nicotine screen, urine   Lipid Panel With LDL/HDL Ratio   Renal Function Panel   Influenza vaccine needed       administered   Relevant Orders   Flu Vaccine QUAD 36+ mos IM (Completed)   Need for diphtheria-tetanus-pertussis (Tdap) vaccine       administered   Relevant Orders   Tdap vaccine greater than or equal to 7yo IM (Completed)   Colon cancer screening       guaiac negative   Relevant Orders   POCT occult blood stool (Completed)      No orders of the defined types were placed in this encounter.     Dr. [facility]  11/27/17

## 2017-11-28 LAB — NICOTINE SCREEN, URINE: COTININE UR QL SCN: NEGATIVE ng/mL

## 2017-11-28 LAB — LIPID PANEL WITH LDL/HDL RATIO
Cholesterol, Total: 133 mg/dL (ref 100–199)
HDL: 54 mg/dL (ref >39)
LDL Calculated: 67 mg/dL (ref 0–99)
LDl/HDL Ratio: 1.2 ratio (ref 0.0–3.6)
Triglycerides: 61 mg/dL (ref 0–149)
VLDL Cholesterol Cal: 12 mg/dL (ref 5–40)

## 2017-11-28 LAB — RENAL FUNCTION PANEL
Albumin: 4.1 g/dL (ref 3.6–4.8)
BUN/Creatinine Ratio: 11 (ref 10–24)
BUN: 12 mg/dL (ref 8–27)
CO2: 26 mmol/L (ref 20–29)
Calcium: 8.9 mg/dL (ref 8.6–10.2)
Chloride: 102 mmol/L (ref 96–106)
Creatinine, Ser: 1.05 mg/dL (ref 0.76–1.27)
GFR calc Af Amer: 87 mL/min/{1.73_m2} (ref >59)
GFR calc non Af Amer: 75 mL/min/{1.73_m2} (ref >59)
Glucose: 87 mg/dL (ref 65–99)
Phosphorus: 3.1 mg/dL (ref 2.5–4.5)
Potassium: 4.4 mmol/L (ref 3.5–5.2)
Sodium: 142 mmol/L (ref 134–144)

## 2018-11-24 ENCOUNTER — Encounter: Payer: 59 | Admitting: Family Medicine

## 2018-12-09 ENCOUNTER — Encounter: Payer: 59 | Admitting: Family Medicine

## 2018-12-11 ENCOUNTER — Other Ambulatory Visit: Payer: Self-pay

## 2018-12-11 ENCOUNTER — Encounter: Payer: Self-pay | Admitting: Family Medicine

## 2018-12-11 ENCOUNTER — Ambulatory Visit (INDEPENDENT_AMBULATORY_CARE_PROVIDER_SITE_OTHER): Payer: 59 | Admitting: Family Medicine

## 2018-12-11 VITALS — BP 118/70 | HR 64 | Ht 73.0 in | Wt 235.0 lb

## 2018-12-11 DIAGNOSIS — E785 Hyperlipidemia, unspecified: Secondary | ICD-10-CM

## 2018-12-11 DIAGNOSIS — Z1159 Encounter for screening for other viral diseases: Secondary | ICD-10-CM

## 2018-12-11 DIAGNOSIS — I10 Essential (primary) hypertension: Secondary | ICD-10-CM

## 2018-12-11 DIAGNOSIS — Z0000 Encounter for general adult medical examination without abnormal findings: Secondary | ICD-10-CM | POA: Diagnosis not present

## 2018-12-11 DIAGNOSIS — Z23 Encounter for immunization: Secondary | ICD-10-CM | POA: Diagnosis not present

## 2018-12-11 DIAGNOSIS — Z1211 Encounter for screening for malignant neoplasm of colon: Secondary | ICD-10-CM | POA: Diagnosis not present

## 2018-12-11 LAB — HEMOCCULT GUIAC POC 1CARD (OFFICE): Fecal Occult Blood, POC: NEGATIVE

## 2018-12-11 NOTE — Patient Instructions (Signed)
Mediterranean Diet A Mediterranean diet refers to food and lifestyle choices that are based on the traditions of countries located on the [place]. This way of eating has been shown to help prevent certain conditions and improve outcomes for people who have chronic diseases, like kidney disease and heart disease. What are tips for following this plan? Lifestyle  Cook and eat meals together with your family, when possible.  Drink enough fluid to keep your urine clear or pale yellow.  Be physically active every day. This includes: ? Aerobic exercise like running or swimming. ? Leisure activities like gardening, walking, or housework.  Get 7-8 hours of sleep each night.  If recommended by your health care provider, drink red wine in moderation. This means 1 glass a day for nonpregnant women and 2 glasses a day for men. A glass of wine equals 5 oz (150 mL). Reading food labels   Check the serving size of packaged foods. For foods such as Caffey and pasta, the serving size refers to the amount of cooked product, not dry.  Check the total fat in packaged foods. Avoid foods that have saturated fat or trans fats.  Check the ingredients list for added sugars, such as corn syrup. Shopping  At the grocery store, buy most of your food from the areas near the walls of the store. This includes: ? Fresh fruits and vegetables (produce). ? Grains, beans, nuts, and seeds. Some of these may be available in unpackaged forms or large amounts (in bulk). ? Fresh seafood. ? Poultry and eggs. ? Low-fat dairy products.  Buy whole ingredients instead of prepackaged foods.  Buy fresh fruits and vegetables in-season from local farmers markets.  Buy frozen fruits and vegetables in resealable bags.  If you do not have access to quality fresh seafood, buy precooked frozen shrimp or canned fish, such as tuna, salmon, or sardines.  Buy small amounts of raw or cooked vegetables, salads, or olives from  the deli or salad bar at your store.  Stock your pantry so you always have certain foods on hand, such as olive oil, canned tuna, canned tomatoes, Sura, pasta, and beans. Cooking  Cook foods with extra-virgin olive oil instead of using butter or other vegetable oils.  Have meat as a side dish, and have vegetables or grains as your main dish. This means having meat in small portions or adding small amounts of meat to foods like pasta or stew.  Use beans or vegetables instead of meat in common dishes like chili or lasagna.  Experiment with different cooking methods. Try roasting or broiling vegetables instead of steaming or sauteing them.  Add frozen vegetables to soups, stews, pasta, or Lupa.  Add nuts or seeds for added healthy fat at each meal. You can add these to yogurt, salads, or vegetable dishes.  Marinate fish or vegetables using olive oil, lemon juice, garlic, and fresh herbs. Meal planning   Plan to eat 1 vegetarian meal one day each week. Try to work up to 2 vegetarian meals, if possible.  Eat seafood 2 or more times a week.  Have healthy snacks readily available, such as: ? Vegetable sticks with hummus. ? Greek yogurt. ? Fruit and nut trail mix.  Eat balanced meals throughout the week. This includes: ? Fruit: 2-3 servings a day ? Vegetables: 4-5 servings a day ? Low-fat dairy: 2 servings a day ? Fish, poultry, or lean meat: 1 serving a day ? Beans and legumes: 2 or more servings a week ?   Nuts and seeds: 1-2 servings a day ? Whole grains: 6-8 servings a day ? Extra-virgin olive oil: 3-4 servings a day  Limit red meat and sweets to only a few servings a month What are my food choices?  Mediterranean diet ? Recommended  Grains: Whole-grain pasta. Brown Babler. Bulgar wheat. Polenta. Couscous. Whole-wheat bread. Oatmeal. Quinoa.  Vegetables: Artichokes. Beets. Broccoli. Cabbage. Carrots. Eggplant. Green beans. Chard. Kale. Spinach. Onions. Leeks. Peas. Squash.  Tomatoes. Peppers. Radishes.  Fruits: Apples. Apricots. Avocado. Berries. Bananas. Cherries. Dates. Figs. Grapes. Lemons. Melon. Oranges. Peaches. Plums. Pomegranate.  Meats and other protein foods: Beans. Almonds. Sunflower seeds. Pine nuts. Peanuts. Cod. Salmon. Scallops. Shrimp. Tuna. Tilapia. Clams. Oysters. Eggs.  Dairy: Low-fat milk. Cheese. Greek yogurt.  Beverages: Water. Red wine. Herbal tea.  Fats and oils: Extra virgin olive oil. Avocado oil. Grape seed oil.  Sweets and desserts: Greek yogurt with honey. Baked apples. Poached pears. Trail mix.  Seasoning and other foods: Basil. Cilantro. Coriander. Cumin. Mint. Parsley. Sage. Rosemary. Tarragon. Garlic. Oregano. Thyme. Pepper. Balsalmic vinegar. Tahini. Hummus. Tomato sauce. Olives. Mushrooms. ? Limit these  Grains: Prepackaged pasta or Idler dishes. Prepackaged cereal with added sugar.  Vegetables: Deep fried potatoes (french fries).  Fruits: Fruit canned in syrup.  Meats and other protein foods: Beef. Pork. Lamb. Poultry with skin. Hot dogs. Bacon.  Dairy: Ice cream. Sour cream. Whole milk.  Beverages: Juice. Sugar-sweetened soft drinks. Beer. Liquor and spirits.  Fats and oils: Butter. Canola oil. Vegetable oil. Beef fat (tallow). Lard.  Sweets and desserts: Cookies. Cakes. Pies. Candy.  Seasoning and other foods: Mayonnaise. Premade sauces and marinades. The items listed may not be a complete list. Talk with your dietitian about what dietary choices are right for you. Summary  The Mediterranean diet includes both food and lifestyle choices.  Eat a variety of fresh fruits and vegetables, beans, nuts, seeds, and whole grains.  Limit the amount of red meat and sweets that you eat.  Talk with your health care provider about whether it is safe for you to drink red wine in moderation. This means 1 glass a day for nonpregnant women and 2 glasses a day for men. A glass of wine equals 5 oz (150 mL). This information  is not intended to replace advice given to you by your health care provider. Make sure you discuss any questions you have with your health care provider. Document Released: 10/20/2015 Document Revised: 10/27/2015 Document Reviewed: 10/20/2015 Elsevier Patient Education  2020 [employer]  

## 2018-12-11 NOTE — Progress Notes (Signed)
Date:  12/11/2018   Name:  Dustin Arnold   DOB:  20-Apr-1955   MRN:  [id number]   Chief Complaint: Annual Exam and influenza vacc need  Patient is a 63 year old male who presents for a comprehensive physical exam. The patient reports the following problems: none. Health maintenance has been reviewed up to date.   Review of Systems  Constitutional: Negative for appetite change, chills, fatigue, fever and unexpected weight change.  HENT: Negative for drooling, ear discharge, ear pain, facial swelling, hearing loss, nosebleeds, sneezing, sore throat and trouble swallowing.   Eyes: Negative for photophobia, pain, discharge, redness, itching and visual disturbance.  Respiratory: Negative for cough, choking, chest tightness, shortness of breath and wheezing.   Cardiovascular: Negative for chest pain, palpitations and leg swelling.  Gastrointestinal: Negative for abdominal pain, blood in stool, constipation, diarrhea, nausea, rectal pain and vomiting.  Endocrine: Negative for cold intolerance, heat intolerance, polydipsia, polyphagia and polyuria.  Genitourinary: Negative for decreased urine volume, difficulty urinating, discharge, dysuria, flank pain, frequency, hematuria, penile pain, penile swelling, scrotal swelling, testicular pain and urgency.  Musculoskeletal: Negative for back pain, joint swelling, myalgias, neck pain and neck stiffness.  Skin: Negative for color change and rash.  Allergic/Immunologic: Negative for environmental allergies and immunocompromised state.  Neurological: Negative for dizziness, tremors, seizures, syncope, speech difficulty, weakness, light-headedness, numbness and headaches.  Hematological: Does not bruise/bleed easily.  Psychiatric/Behavioral: Negative for agitation, behavioral problems, confusion, dysphoric mood, hallucinations, self-injury and suicidal ideas. The patient is not nervous/anxious.     Patient Active Problem List   Diagnosis Date Noted  .  Prostate cancer screening 11/30/2014  . H/O coronary artery bypass surgery 11/06/2013  . Arteriosclerosis of autologous vein coronary artery bypass graft 10/14/2013  . Benign essential HTN 10/14/2013  . HLD (hyperlipidemia) 10/14/2013    Allergies  Allergen Reactions  . Other Other (See Comments)    IVP dye    Past Surgical History:  Procedure Laterality Date  . CARDIAC SURGERY     bypass  . CHOLECYSTECTOMY    . COLONOSCOPY  2006   cleared for [facility]  . COLONOSCOPY WITH PROPOFOL N/A 01/07/2015   Procedure: COLONOSCOPY WITH PROPOFOL;  Surgeon: Wallace Cullens, MD;  Location: [facility] ENDOSCOPY;  Service: Gastroenterology;  Laterality: N/A;  . CORONARY ARTERY BYPASS GRAFT      Social History   Tobacco Use  . Smoking status: Never Smoker  . Smokeless tobacco: Never Used  Substance Use Topics  . Alcohol use: Yes    Alcohol/week: 0.0 standard drinks  . Drug use: No     Medication list has been reviewed and updated.  No outpatient medications have been marked as taking for the 12/11/18 encounter (Office Visit) with Duanne Limerick, MD.    [facility] 2/9 Scores 11/27/2017 11/27/2017 11/30/2014  PHQ - 2 Score 0 0 0  PHQ- 9 Score 0 - -    BP Readings from Last 3 Encounters:  12/11/18 118/70  11/27/17 120/80  05/18/17 119/75    Physical Exam Vitals signs and nursing note reviewed.  Constitutional:      Appearance: Normal appearance. He is well-developed and normal weight.     Comments: Muscular/  HENT:     Head: Normocephalic.     Jaw: There is normal jaw occlusion.     Salivary Glands: Right salivary gland is not diffusely enlarged. Left salivary gland is not diffusely enlarged.     Right Ear: Hearing, tympanic membrane, ear canal and external  ear normal. There is no impacted cerumen.     Left Ear: Hearing, tympanic membrane, ear canal and external ear normal. There is no impacted cerumen.     Nose: Nose normal. No nasal deformity, congestion or rhinorrhea.     Right Turbinates: Not  enlarged.     Left Turbinates: Not enlarged.     Mouth/Throat:     Lips: Pink.     Mouth: Mucous membranes are moist.     Dentition: Normal dentition.     Tongue: No lesions.     Palate: No mass.     Pharynx: Oropharynx is clear. Uvula midline. No pharyngeal swelling or posterior oropharyngeal erythema.  Eyes:     General: Lids are normal. Vision grossly intact. Gaze aligned appropriately. No scleral icterus.       Right eye: No discharge.        Left eye: No discharge.     Extraocular Movements: Extraocular movements intact.     Conjunctiva/sclera: Conjunctivae normal.     Pupils: Pupils are equal, round, and reactive to light.     Funduscopic exam:    Right eye: No AV nicking.        Left eye: No AV nicking.  Neck:     Musculoskeletal: Full passive range of motion without pain, normal range of motion and neck supple.     Thyroid: No thyroid mass, thyromegaly or thyroid tenderness.     Vascular: No carotid bruit, hepatojugular reflux or JVD.     Trachea: Trachea and phonation normal. No tracheal deviation.  Cardiovascular:     Rate and Rhythm: Normal rate and regular rhythm.     Chest Wall: PMI is not displaced.     Pulses: Normal pulses. No decreased pulses.          Carotid pulses are 2+ on the right side and 2+ on the left side.      Radial pulses are 2+ on the right side and 2+ on the left side.       Femoral pulses are 2+ on the right side and 2+ on the left side.      Popliteal pulses are 2+ on the right side and 2+ on the left side.       Dorsalis pedis pulses are 2+ on the right side and 2+ on the left side.       Posterior tibial pulses are 2+ on the right side and 2+ on the left side.     Heart sounds: Normal heart sounds, S1 normal and S2 normal. No murmur. No systolic murmur. No diastolic murmur. No friction rub. No gallop. No S3 or S4 sounds.   Pulmonary:     Effort: No respiratory distress.     Breath sounds: Normal breath sounds. No stridor or transmitted upper  airway sounds. No decreased breath sounds, wheezing, rhonchi or rales.  Chest:     Chest wall: No mass or tenderness.     Breasts:        Right: Normal.        Left: Normal.  Abdominal:     General: Bowel sounds are normal. There is no distension.     Palpations: Abdomen is soft. There is no hepatomegaly, splenomegaly or mass.     Tenderness: There is no abdominal tenderness. There is no right CVA tenderness, left CVA tenderness, guarding or rebound.     Hernia: No hernia is present.  Genitourinary:    Penis: Normal and uncircumcised.  Scrotum/Testes: Normal.        Right: Mass not present.        Left: Mass not present.     Prostate: Normal. Not enlarged, not tender and no nodules present.     Rectum: Normal. Guaiac result negative. No mass.  Musculoskeletal: Normal range of motion.        General: No tenderness.     Cervical back: Normal.     Thoracic back: Normal.     Lumbar back: Normal.     Right lower leg: No edema.     Left lower leg: No edema.  Lymphadenopathy:     Head:     Right side of head: No submental, submandibular or tonsillar adenopathy.     Left side of head: No submental, submandibular or tonsillar adenopathy.     Cervical: No cervical adenopathy.     Right cervical: No superficial, deep or posterior cervical adenopathy.    Left cervical: No superficial, deep or posterior cervical adenopathy.     Upper Body:     Right upper body: No supraclavicular or axillary adenopathy.     Left upper body: No supraclavicular or axillary adenopathy.     Lower Body: No right inguinal adenopathy. No left inguinal adenopathy.  Skin:    General: Skin is warm.     Capillary Refill: Capillary refill takes less than 2 seconds.     Findings: No rash.  Neurological:     Mental Status: He is alert and oriented to person, place, and time.     Cranial Nerves: Cranial nerves are intact. No cranial nerve deficit.     Sensory: Sensation is intact.     Motor: Motor function is  intact.     Deep Tendon Reflexes: Reflexes are normal and symmetric.     Reflex Scores:      Tricep reflexes are 2+ on the right side and 2+ on the left side.      Bicep reflexes are 2+ on the right side and 2+ on the left side.      Brachioradialis reflexes are 2+ on the right side and 2+ on the left side.      Patellar reflexes are 2+ on the right side and 2+ on the left side.      Achilles reflexes are 2+ on the right side and 2+ on the left side. Psychiatric:        Attention and Perception: Attention and perception normal.        Mood and Affect: Mood and affect normal.        Speech: Speech normal.        Behavior: Behavior normal. Behavior is cooperative.        Cognition and Memory: Cognition normal.     Wt Readings from Last 3 Encounters:  12/11/18 235 lb (106.6 kg)  11/27/17 235 lb (106.6 kg)  05/18/17 230 lb (104.3 kg)    BP 118/70   Pulse 64   Ht 6\' 1"  (1.854 m)   Wt 235 lb (106.6 kg)   BMI 31.00 kg/m   Assessment and Plan:  1. Annual physical exam No subjective/objective concerns noted during history and physical exam.  Patient's previous evaluations were reviewed including specialty notes with cardiology.Tori MilksBarney D Rosales is a 63 y.o. male who presents today for his Complete Annual Exam. He feels well. He reports exercising/hiking . He reports he is sleeping well. Immunizations are reviewed and recommendations provided.   Age appropriate screening tests are discussed.  Counseling given for risk factor reduction interventions. - CBC with Differential/Platelet  2. Need for hepatitis C screening test Patient needs criteria for screening for hepatitis C and we will evaluate for that. - Hepatitis C antibody  3. Hyperlipidemia, unspecified hyperlipidemia type Patient is at risk for coronary artery disease and we will check lipid panel - Lipid Panel With LDL/HDL Ratio  4. Benign essential HTN Chronic.  Controlled.  Will check renal function panel.  This is followed by  cardiology. - Renal Function Panel  5. Colon cancer screening Guaiac was noted to be negative - POCT Occult Blood Stool  6. Influenza vaccine needed Discussed and administered - Flu Vaccine QUAD 6+ mos PF IM (Fluarix Quad PF)

## 2018-12-12 LAB — RENAL FUNCTION PANEL
Albumin: 4.2 g/dL (ref 3.8–4.8)
BUN/Creatinine Ratio: 11 (ref 10–24)
BUN: 11 mg/dL (ref 8–27)
CO2: 26 mmol/L (ref 20–29)
Calcium: 9.2 mg/dL (ref 8.6–10.2)
Chloride: 101 mmol/L (ref 96–106)
Creatinine, Ser: 1.04 mg/dL (ref 0.76–1.27)
GFR calc Af Amer: 87 mL/min/{1.73_m2} (ref >59)
GFR calc non Af Amer: 76 mL/min/{1.73_m2} (ref >59)
Glucose: 96 mg/dL (ref 65–99)
Phosphorus: 3.3 mg/dL (ref 2.8–4.1)
Potassium: 5 mmol/L (ref 3.5–5.2)
Sodium: 141 mmol/L (ref 134–144)

## 2018-12-12 LAB — CBC WITH DIFFERENTIAL/PLATELET
Basophils Absolute: 0 10*3/uL (ref 0.0–0.2)
Basos: 0 %
EOS (ABSOLUTE): 0.2 10*3/uL (ref 0.0–0.4)
Eos: 3 %
Hematocrit: 42.3 % (ref 37.5–51.0)
Hemoglobin: 14.1 g/dL (ref 13.0–17.7)
Immature Grans (Abs): 0 10*3/uL (ref 0.0–0.1)
Immature Granulocytes: 0 %
Lymphocytes Absolute: 1.6 10*3/uL (ref 0.7–3.1)
Lymphs: 30 %
MCH: 30.9 pg (ref 26.6–33.0)
MCHC: 33.3 g/dL (ref 31.5–35.7)
MCV: 93 fL (ref 79–97)
Monocytes Absolute: 0.4 10*3/uL (ref 0.1–0.9)
Monocytes: 8 %
Neutrophils Absolute: 3.2 10*3/uL (ref 1.4–7.0)
Neutrophils: 59 %
Platelets: 184 10*3/uL (ref 150–450)
RBC: 4.57 x10E6/uL (ref 4.14–5.80)
RDW: 12.7 % (ref 11.6–15.4)
WBC: 5.5 10*3/uL (ref 3.4–10.8)

## 2018-12-12 LAB — LIPID PANEL WITH LDL/HDL RATIO
Cholesterol, Total: 161 mg/dL (ref 100–199)
HDL: 65 mg/dL (ref >39)
LDL Chol Calc (NIH): 80 mg/dL (ref 0–99)
LDL/HDL Ratio: 1.2 ratio (ref 0.0–3.6)
Triglycerides: 83 mg/dL (ref 0–149)
VLDL Cholesterol Cal: 16 mg/dL (ref 5–40)

## 2018-12-12 LAB — HEPATITIS C ANTIBODY: Hep C Virus Ab: <0.1 s/co ratio (ref 0.0–0.9)

## 2019-01-19 ENCOUNTER — Encounter: Payer: Self-pay | Admitting: Podiatry

## 2019-01-19 ENCOUNTER — Ambulatory Visit (INDEPENDENT_AMBULATORY_CARE_PROVIDER_SITE_OTHER): Payer: 59

## 2019-01-19 ENCOUNTER — Ambulatory Visit (INDEPENDENT_AMBULATORY_CARE_PROVIDER_SITE_OTHER): Payer: 59 | Admitting: Podiatry

## 2019-01-19 ENCOUNTER — Other Ambulatory Visit: Payer: Self-pay | Admitting: Podiatry

## 2019-01-19 ENCOUNTER — Other Ambulatory Visit: Payer: Self-pay

## 2019-01-19 DIAGNOSIS — M778 Other enthesopathies, not elsewhere classified: Secondary | ICD-10-CM

## 2019-01-19 DIAGNOSIS — K635 Polyp of colon: Secondary | ICD-10-CM | POA: Insufficient documentation

## 2019-01-19 MED ORDER — METHYLPREDNISOLONE 4 MG PO TBPK: ORAL_TABLET | ORAL | 0 refills | Status: DC

## 2019-01-19 MED ORDER — MELOXICAM 15 MG PO TABS: 15.0000 mg | ORAL_TABLET | Freq: Every day | ORAL | 3 refills | Status: DC

## 2019-01-19 NOTE — Progress Notes (Signed)
Subjective:  Patient ID: Dustin Arnold, male    DOB: 19-Sep-1955,  MRN: [id number] HPI Chief Complaint  Patient presents with  . Foot Pain    Patient presents today for right forefoot pain and numb 2nd,3rd toes x 3-4 months  He states "it feels like a bruise when I walk and something between my two toes"  He has tried ice and Aleve for relief    63 y.o. male presents with the above complaint.   Denies fever chills nausea vomiting muscle aches pains calf pain back pain chest pain shortness of breath.  Past Medical History:  Diagnosis Date  . Coronary artery disease   . Hyperlipemia   . Hypertension    Past Surgical History:  Procedure Laterality Date  . CARDIAC SURGERY     bypass  . CHOLECYSTECTOMY    . COLONOSCOPY  2006   cleared for [facility]  . COLONOSCOPY WITH PROPOFOL N/A 01/07/2015   Procedure: COLONOSCOPY WITH PROPOFOL;  Surgeon: Hulen Luster, MD;  Location: [facility] ENDOSCOPY;  Service: Gastroenterology;  Laterality: N/A;  . CORONARY ARTERY BYPASS GRAFT      Current Outpatient Medications:  .  aspirin 325 MG tablet, Take 1 tablet by mouth daily., Disp: , Rfl:  .  atorvastatin (LIPITOR) 80 MG tablet, Take 1 tablet by mouth daily., Disp: , Rfl:  .  meloxicam (MOBIC) 15 MG tablet, Take 1 tablet (15 mg total) by mouth daily., Disp: 30 tablet, Rfl: 3 .  methylPREDNISolone (MEDROL DOSEPAK) 4 MG TBPK tablet, 6 day dose pack - take as directed, Disp: 21 tablet, Rfl: 0 .  metoprolol succinate (TOPROL-XL) 50 MG 24 hr tablet, Take 1 tablet by mouth daily., Disp: , Rfl:   Allergies  Allergen Reactions  . Other Other (See Comments)    IVP dye   Review of Systems Objective:  There were no vitals filed for this visit.  General: Well developed, nourished, in no acute distress, alert and oriented x3   Dermatological: Skin is warm, dry and supple bilateral. Nails x 10 are well maintained; remaining integument appears unremarkable at this time. There are no open sores, no preulcerative  lesions, no rash or signs of infection present.  Vascular: Dorsalis Pedis artery and Posterior Tibial artery pedal pulses are 2/4 bilateral with immedate capillary fill time. Pedal hair growth present. No varicosities and no lower extremity edema present bilateral.   Neruologic: Grossly intact via light touch bilateral. Vibratory intact via tuning fork bilateral. Protective threshold with Semmes Wienstein monofilament intact to all pedal sites bilateral. Patellar and Achilles deep tendon reflexes 2+ bilateral. No Babinski or clonus noted bilateral.   Musculoskeletal: No gross boney pedal deformities bilateral. No pain, crepitus, or limitation noted with foot and ankle range of motion bilateral. Muscular strength 5/5 in all groups tested bilateral.  Pain on palpation and end range of motion of the second metatarsophalangeal joint of the right foot.  Gait: Unassisted, Nonantalgic.    Radiographs:  Radiographs taken today demonstrate a rectus foot type no significant acute findings noted.  Assessment & Plan:   Assessment: Capsulitis second metatarsophalangeal joint right foot.  Plantarflexed metatarsal second right.  Plan: Discussed etiology pathology conservative versus surgical therapies after sterile Betadine skin prep I injected 10 mg of Kenalog 5 mg of Marcaine around the second metatarsophalangeal joint.  Started him on a Medrol Dosepak to be followed by meloxicam.  Discussed appropriate shoe gear stretching exercises ice therapy and shoe gear modifications.  Follow-up with him in 1 month.  Garrel Ridgel, DPM

## 2019-03-02 ENCOUNTER — Ambulatory Visit: Payer: 59 | Admitting: Podiatry

## 2019-05-11 ENCOUNTER — Ambulatory Visit (INDEPENDENT_AMBULATORY_CARE_PROVIDER_SITE_OTHER): Payer: Medicare Other | Admitting: Family Medicine

## 2019-05-11 ENCOUNTER — Encounter: Payer: Self-pay | Admitting: Family Medicine

## 2019-05-11 ENCOUNTER — Other Ambulatory Visit: Payer: Self-pay

## 2019-05-11 VITALS — BP 130/80 | HR 64 | Ht 73.0 in | Wt 245.0 lb

## 2019-05-11 DIAGNOSIS — M25562 Pain in left knee: Secondary | ICD-10-CM

## 2019-05-11 DIAGNOSIS — G8929 Other chronic pain: Secondary | ICD-10-CM

## 2019-05-11 DIAGNOSIS — S838X2A Sprain of other specified parts of left knee, initial encounter: Secondary | ICD-10-CM

## 2019-05-11 MED ORDER — MELOXICAM 15 MG PO TABS: 15.0000 mg | ORAL_TABLET | Freq: Every day | ORAL | 0 refills | Status: DC

## 2019-05-11 NOTE — Progress Notes (Signed)
Date:  05/11/2019   Name:  Dustin Arnold   DOB:  [birth year]   MRN:  [id number]   Chief Complaint: Knee Pain (hurting above and below the L) knee)  Knee Pain  The incident occurred more than 1 week ago (3 weeks ago). Incident location: hiking club. Injury mechanism: aftering 5 miles x 2. The pain is present in the left knee. The pain is at a severity of 7/10. The pain is moderate. The pain has been fluctuating since onset. Associated symptoms include a loss of motion. Pertinent negatives include no inability to bear weight, loss of sensation, muscle weakness, numbness or tingling. Associated symptoms comments: Extension block. The symptoms are aggravated by movement, palpation and weight bearing. He has tried NSAIDs for the symptoms. The treatment provided mild relief.    Lab Results  Component Value Date   CREATININE 1.04 12/11/2018   BUN 11 12/11/2018   NA 141 12/11/2018   K 5.0 12/11/2018   CL 101 12/11/2018   CO2 26 12/11/2018   Lab Results  Component Value Date   CHOL 161 12/11/2018   HDL 65 12/11/2018   LDLCALC 80 12/11/2018   TRIG 83 12/11/2018   CHOLHDL 2.2 12/02/2015   No results found for: TSH No results found for: HGBA1C   Review of Systems  Constitutional: Negative for chills and fever.  HENT: Negative for drooling, ear discharge, ear pain and sore throat.   Respiratory: Negative for cough, shortness of breath and wheezing.   Cardiovascular: Negative for chest pain, palpitations and leg swelling.  Gastrointestinal: Negative for abdominal pain, blood in stool, constipation, diarrhea and nausea.  Endocrine: Negative for polydipsia.  Genitourinary: Negative for dysuria, frequency, hematuria and urgency.  Musculoskeletal: Negative for back pain, myalgias and neck pain.  Skin: Negative for rash.  Allergic/Immunologic: Negative for environmental allergies.  Neurological: Negative for dizziness, tingling, numbness and headaches.  Hematological: Does not bruise/bleed  easily.  Psychiatric/Behavioral: Negative for suicidal ideas. The patient is not nervous/anxious.     Patient Active Problem List   Diagnosis Date Noted  . Colon polyp 01/19/2019  . Prostate cancer screening 11/30/2014  . H/O coronary artery bypass surgery 11/06/2013  . Presence of aortocoronary bypass graft 11/06/2013  . Arteriosclerosis of autologous vein coronary artery bypass graft 10/14/2013  . Benign essential HTN 10/14/2013  . HLD (hyperlipidemia) 10/14/2013    Allergies  Allergen Reactions  . Other Other (See Comments)    IVP dye    Past Surgical History:  Procedure Laterality Date  . CARDIAC SURGERY     bypass  . CHOLECYSTECTOMY    . COLONOSCOPY  2006   cleared for [facility]  . COLONOSCOPY WITH PROPOFOL N/A 01/07/2015   Procedure: COLONOSCOPY WITH PROPOFOL;  Surgeon: Wallace Cullens, MD;  Location[facility] ENDOSCOPY;  Service: Gastroenterology;  Laterality: N/A;  . CORONARY ARTERY BYPASS GRAFT      Social History   Tobacco Use  . Smoking status: Never Smoker  . Smokeless tobacco: Never Used  Substance Use Topics  . Alcohol use: Yes    Alcohol/week: 0.0 standard drinks  . Drug use: No     Medication list has been reviewed and updated.  Current Meds  Medication Sig  . aspirin 325 MG tablet Take 1 tablet by mouth daily.  Marland Kitchen atorvastatin (LIPITOR) 80 MG tablet Take 1 tablet by mouth daily.  . metoprolol succinate (TOPROL-XL) 50 MG 24 hr tablet Take 1 tablet by mouth daily.    PHQ 2/9 Scores  12/11/2018 11/27/2017 11/27/2017 11/30/2014  PHQ - 2 Score 0 0 0 0  PHQ- 9 Score 0 0 - -    BP Readings from Last 3 Encounters:  05/11/19 130/80  12/11/18 118/70  11/27/17 120/80    Physical Exam Vitals and nursing note reviewed.  HENT:     Head: Normocephalic.     Right Ear: Tympanic membrane, ear canal and external ear normal. There is no impacted cerumen.     Left Ear: Tympanic membrane, ear canal and external ear normal. There is no impacted cerumen.     Nose: Nose  normal. No congestion or rhinorrhea.  Eyes:     General: No scleral icterus.       Right eye: No discharge.        Left eye: No discharge.     Conjunctiva/sclera: Conjunctivae normal.     Pupils: Pupils are equal, round, and reactive to light.  Neck:     Thyroid: No thyromegaly.     Vascular: No JVD.     Trachea: No tracheal deviation.  Cardiovascular:     Rate and Rhythm: Normal rate and regular rhythm.     Heart sounds: Normal heart sounds. No murmur. No friction rub. No gallop.   Pulmonary:     Effort: No respiratory distress.     Breath sounds: Normal breath sounds. No wheezing, rhonchi or rales.  Abdominal:     General: Bowel sounds are normal.     Palpations: Abdomen is soft. There is no mass.     Tenderness: There is no abdominal tenderness. There is no guarding or rebound.  Musculoskeletal:     Cervical back: Normal range of motion and neck supple.     Left knee: Swelling and effusion present. Decreased range of motion. Tenderness present over the medial joint line.  Lymphadenopathy:     Cervical: No cervical adenopathy.  Skin:    General: Skin is warm.     Findings: No rash.  Neurological:     Mental Status: He is alert and oriented to person, place, and time.     Cranial Nerves: No cranial nerve deficit.     Deep Tendon Reflexes: Reflexes are normal and symmetric.     Wt Readings from Last 3 Encounters:  05/11/19 245 lb (111.1 kg)  12/11/18 235 lb (106.6 kg)  11/27/17 235 lb (106.6 kg)    BP 130/80   Pulse 64   Ht 6\' 1"  (1.854 m)   Wt 245 lb (111.1 kg)   BMI 32.32 kg/m   Assessment and Plan: 1. Acute meniscal injury of left knee, initial encounter Patient has had a recent exacerbation of his left knee discomfort which he has had low-grade pain for several years but after joining a hiking club has sustained after two 5 mile walks sustained swelling and discomfort of the left knee with tenderness in the medial joint line.  This is consistent with either a  meniscal concern or baseline arthritis which is flared due to the increased activity.  Patient has been given meloxicam 15 mg to initiate and referral to orthopedic surgery with Ellard Artis for evaluation and treatment thereof. - meloxicam (MOBIC) 15 MG tablet; Take 1 tablet (15 mg total) by mouth daily.  Dispense: 30 tablet; Refill: 0 - Ambulatory referral to Orthopedic Surgery  2. Chronic pain of left knee Patient's had chronic knee pain due to football injuries in the past but have been able to tolerate in the past with over-the-counter NSAIDs/Tylenol but this  is been an episode that has not resolved on its own and needs further evaluation. - meloxicam (MOBIC) 15 MG tablet; Take 1 tablet (15 mg total) by mouth daily.  Dispense: 30 tablet; Refill: 0 - Ambulatory referral to Orthopedic Surgery

## 2019-05-12 DIAGNOSIS — M25562 Pain in left knee: Secondary | ICD-10-CM | POA: Diagnosis not present

## 2019-05-20 DIAGNOSIS — Z0120 Encounter for dental examination and cleaning without abnormal findings: Secondary | ICD-10-CM | POA: Diagnosis not present

## 2019-06-09 ENCOUNTER — Other Ambulatory Visit: Payer: Self-pay

## 2019-06-09 DIAGNOSIS — S838X2A Sprain of other specified parts of left knee, initial encounter: Secondary | ICD-10-CM

## 2019-06-09 DIAGNOSIS — G8929 Other chronic pain: Secondary | ICD-10-CM

## 2019-06-09 DIAGNOSIS — M25562 Pain in left knee: Secondary | ICD-10-CM

## 2019-06-09 MED ORDER — MELOXICAM 15 MG PO TABS: 15.0000 mg | ORAL_TABLET | Freq: Every day | ORAL | 0 refills | Status: DC

## 2019-07-01 ENCOUNTER — Ambulatory Visit (INDEPENDENT_AMBULATORY_CARE_PROVIDER_SITE_OTHER): Payer: Medicare Other | Admitting: Family Medicine

## 2019-07-01 ENCOUNTER — Ambulatory Visit
Admission: RE | Admit: 2019-07-01 | Discharge: 2019-07-01 | Disposition: A | Discharge status: Home / Self Care | Payer: Medicare Other | Source: Ambulatory Visit | Attending: Family Medicine | Admitting: Family Medicine

## 2019-07-01 ENCOUNTER — Encounter: Payer: Self-pay | Admitting: Family Medicine

## 2019-07-01 ENCOUNTER — Ambulatory Visit
Admission: RE | Admit: 2019-07-01 | Discharge: 2019-07-01 | Disposition: A | Discharge status: Home / Self Care | Payer: Medicare Other | Attending: Family Medicine | Admitting: Family Medicine

## 2019-07-01 ENCOUNTER — Other Ambulatory Visit: Payer: Self-pay

## 2019-07-01 VITALS — BP 120/80 | HR 72 | Ht 73.0 in | Wt 230.0 lb

## 2019-07-01 DIAGNOSIS — M1712 Unilateral primary osteoarthritis, left knee: Secondary | ICD-10-CM | POA: Insufficient documentation

## 2019-07-01 DIAGNOSIS — M5136 Other intervertebral disc degeneration, lumbar region: Secondary | ICD-10-CM

## 2019-07-01 DIAGNOSIS — M545 Low back pain: Secondary | ICD-10-CM | POA: Diagnosis not present

## 2019-07-01 MED ORDER — MELOXICAM 15 MG PO TABS: 15.0000 mg | ORAL_TABLET | Freq: Every day | ORAL | 0 refills | Status: DC

## 2019-07-01 NOTE — Patient Instructions (Signed)
Piriformis Syndrome  Piriformis syndrome is a condition that can cause pain and numbness in your buttocks and down the back of your leg. Piriformis syndrome happens when the small muscle that connects the base of your spine to your hip (piriformis muscle) presses on the nerve that runs down the back of your leg (sciatic nerve). The piriformis muscle helps your hip rotate and helps to bring your leg back and out. It also helps shift your weight to keep you stable while you are walking. The sciatic nerve runs under or through the piriformis muscle. Damage to the piriformis muscle can cause spasms that put pressure on the nerve below. This causes pain and discomfort while sitting and moving. The pain may feel as if it begins in the buttock and spreads (radiates) down your hip and thigh. What are the causes? This condition is caused by pressure on the sciatic nerve from the piriformis muscle. The piriformis muscle can get irritated with overuse, especially if other hip muscles are weak and the piriformis muscle has to do extra work. Piriformis syndrome can also occur after an injury, like a fall onto your buttocks. What increases the risk? You are more likely to develop this condition if you:  Are a woman.  Sit for long periods of time.  Are a cyclist.  Have weak buttocks muscles (gluteal muscles). What are the signs or symptoms? Symptoms of this condition include:  Pain, tingling, or numbness that starts in the buttock and runs down the back of your leg (sciatica).  Pain in the groin or thigh area. Your symptoms may get worse:  The longer you sit.  When you walk, run, or climb stairs.  When straining to have a bowel movement. How is this diagnosed? This condition is diagnosed based on your symptoms, medical history, and physical exam.  During the exam, your health care provider may: ? Move your leg into different positions to check for pain. ? Press on the muscles of your hip and  buttock to see if that increases your symptoms.  You may also have tests, including: ? Imaging tests such as X-rays, MRI, or ultrasound. ? Electromyogram (EMG). This test measures electrical signals sent by your nerves into the muscles. ? Nerve conduction study. This test measures how well electrical signals pass through your nerves. How is this treated? This condition may be treated by:  Stopping all activities that cause pain or make your condition worse.  Applying ice or using heat therapy.  Taking medicines to reduce pain and swelling.  Taking a muscle relaxer (muscle relaxant) to stop muscle spasms.  Doing range-of-motion and strengthening exercises (physical therapy) as told by your health care provider.  Massaging the area.  Having acupuncture.  Getting an injection of medicine in the piriformis muscle. Your health care provider will choose the medicine based on your condition. He or she may inject: ? An anti-inflammatory medicine (steroid) to reduce swelling. ? A numbing medicine (local anesthetic) to block the pain. ? Botulinum toxin. The toxin blocks nerve impulses to specific muscles to reduce muscle tension. In rare cases, you may need surgery to cut the muscle and release pressure on the nerve if other treatments do not work. Follow these instructions at home: Activity  Do not sit for long periods. Get up and walk around every 20 minutes or as often as told by your health care provider. ? When driving long distances, make sure to take frequent stops to get up and stretch.  Use a  cushion when you sit on hard surfaces.  Do exercises as told by your health care provider.  Return to your normal activities as told by your health care provider. Ask your health care provider what activities are safe for you. Managing pain, stiffness, and swelling      If directed, apply heat to the affected area as often as told by your health care provider. Use the heat source that  your health care provider recommends, such as a moist heat pack or a heating pad. ? Place a towel between your skin and the heat source. ? Leave the heat on for 20-30 minutes. ? Remove the heat if your skin turns bright red. This is especially important if you are unable to feel pain, heat, or cold. You may have a greater risk of getting burned.  If directed, put ice on the injured area. ? Put ice in a plastic bag. ? Place a towel between your skin and the bag. ? Leave the ice on for 20 minutes, 2-3 times a day. General instructions  Take over-the-counter and prescription medicines only as told by your health care provider.  Ask your health care provider if the medicine prescribed to you requires you to avoid driving or using heavy machinery.  You may need to take actions to prevent or treat constipation, such as: ? Drink enough fluid to keep your urine pale yellow. ? Take over-the-counter or prescription medicines. ? Eat foods that are high in fiber, such as beans, whole grains, and fresh fruits and vegetables. ? Limit foods that are high in fat and processed sugars, such as fried or sweet foods.  Keep all follow-up visits as told by your health care provider. This is important. How is this prevented?  Do not sit for longer than 20 minutes at a time. When you sit, choose padded surfaces.  Warm up and stretch before being active.  Cool down and stretch after being active.  Give your body time to rest between periods of activity.  Make sure to use equipment that fits you.  Maintain physical fitness, including: ? Strength. ? Flexibility. Contact a health care provider if:  Your pain and stiffness continue or get worse.  Your leg or hip becomes weak.  You have changes in your bowel function or bladder function. Summary  Piriformis syndrome is a condition that can cause pain, tingling, and numbness in your buttocks and down the back of your leg.  You may try applying heat  or ice to relieve the pain.  Do not sit for long periods. Get up and walk around every 20 minutes or as often as told by your health care provider. This information is not intended to replace advice given to you by your health care provider. Make sure you discuss any questions you have with your health care provider. Document Revised: 06/19/2018 Document Reviewed: 10/23/2017 Elsevier Patient Education  [place]. Sciatica  Sciatica is pain, numbness, weakness, or tingling along the path of the sciatic nerve. The sciatic nerve starts in the lower back and runs down the back of each leg. The nerve controls the muscles in the lower leg and in the back of the knee. It also provides feeling (sensation) to the back of the thigh, the lower leg, and the sole of the foot. Sciatica is a symptom of another medical condition that pinches or puts pressure on the sciatic nerve. Sciatica most often only affects one side of the body. Sciatica usually goes away on  its own or with treatment. In some cases, sciatica may come back (recur). What are the causes? This condition is caused by pressure on the sciatic nerve or pinching of the nerve. This may be the result of:  A disk in between the bones of the spine bulging out too far (herniated disk).  Age-related changes in the spinal disks.  A pain disorder that affects a muscle in the buttock.  Extra bone growth near the sciatic nerve.  A break (fracture) of the pelvis.  Pregnancy.  Tumor. This is rare. What increases the risk? The following factors may make you more likely to develop this condition:  Playing sports that place pressure or stress on the spine.  Having poor strength and flexibility.  A history of back injury or surgery.  Sitting for long periods of time.  Doing activities that involve repetitive bending or lifting.  Obesity. What are the signs or symptoms? Symptoms can vary from mild to very severe, and they may  include:  Any of these problems in the lower back, leg, hip, or buttock: ? Mild tingling, numbness, or dull aches. ? Burning sensations. ? Sharp pains.  Numbness in the back of the calf or the sole of the foot.  Leg weakness.  Severe back pain that makes movement difficult. Symptoms may get worse when you cough, sneeze, or laugh, or when you sit or stand for long periods of time. How is this diagnosed? This condition may be diagnosed based on:  Your symptoms and medical history.  A physical exam.  Blood tests.  Imaging tests, such as: ? X-rays. ? MRI. ? CT scan. How is this treated? In many cases, this condition improves on its own without treatment. However, treatment may include:  Reducing or modifying physical activity.  Exercising and stretching.  Icing and applying heat to the affected area.  Medicines that help to: ? Relieve pain and swelling. ? Relax your muscles.  Injections of medicines that help to relieve pain, irritation, and inflammation around the sciatic nerve (steroids).  Surgery. Follow these instructions at home: Medicines  Take over-the-counter and prescription medicines only as told by your health care provider.  Ask your health care provider if the medicine prescribed to you: ? Requires you to avoid driving or using heavy machinery. ? Can cause constipation. You may need to take these actions to prevent or treat constipation:  Drink enough fluid to keep your urine pale yellow.  Take over-the-counter or prescription medicines.  Eat foods that are high in fiber, such as beans, whole grains, and fresh fruits and vegetables.  Limit foods that are high in fat and processed sugars, such as fried or sweet foods. Managing pain      If directed, put ice on the affected area. ? Put ice in a plastic bag. ? Place a towel between your skin and the bag. ? Leave the ice on for 20 minutes, 2-3 times a day.  If directed, apply heat to the  affected area. Use the heat source that your health care provider recommends, such as a moist heat pack or a heating pad. ? Place a towel between your skin and the heat source. ? Leave the heat on for 20-30 minutes. ? Remove the heat if your skin turns bright red. This is especially important if you are unable to feel pain, heat, or cold. You may have a greater risk of getting burned. Activity   Return to your normal activities as told by your health care  provider. Ask your health care provider what activities are safe for you.  Avoid activities that make your symptoms worse.  Take brief periods of rest throughout the day. ? When you rest for longer periods, mix in some mild activity or stretching between periods of rest. This will help to prevent stiffness and pain. ? Avoid sitting for long periods of time without moving. Get up and move around at least one time each hour.  Exercise and stretch regularly, as told by your health care provider.  Do not lift anything that is heavier than 10 lb (4.5 kg) while you have symptoms of sciatica. When you do not have symptoms, you should still avoid heavy lifting, especially repetitive heavy lifting.  When you lift objects, always use proper lifting technique, which includes: ? Bending your knees. ? Keeping the load close to your body. ? Avoiding twisting. General instructions  Maintain a healthy weight. Excess weight puts extra stress on your back.  Wear supportive, comfortable shoes. Avoid wearing high heels.  Avoid sleeping on a mattress that is too soft or too hard. A mattress that is firm enough to support your back when you sleep may help to reduce your pain.  Keep all follow-up visits as told by your health care provider. This is important. Contact a health care provider if:  You have pain that: ? Wakes you up when you are sleeping. ? Gets worse when you lie down. ? Is worse than you have experienced in the past. ? Lasts longer  than 4 weeks.  You have an unexplained weight loss. Get help right away if:  You are not able to control when you urinate or have bowel movements (incontinence).  You have: ? Weakness in your lower back, pelvis, buttocks, or legs that gets worse. ? Redness or swelling of your back. ? A burning sensation when you urinate. Summary  Sciatica is pain, numbness, weakness, or tingling along the path of the sciatic nerve.  This condition is caused by pressure on the sciatic nerve or pinching of the nerve.  Sciatica can cause pain, numbness, or tingling in the lower back, legs, hips, and buttocks.  Treatment often includes rest, exercise, medicines, and applying ice or heat. This information is not intended to replace advice given to you by your health care provider. Make sure you discuss any questions you have with your health care provider. Document Revised: 03/17/2018 Document Reviewed: 03/17/2018 Elsevier Patient Education  2020 [employer].

## 2019-07-01 NOTE — Progress Notes (Signed)
Date:  07/01/2019   Name:  Dustin Arnold   DOB:  [birth year]   MRN:  [id number]   Chief Complaint: Knee Pain (has seen Shepp[facility] Penton and had an injection. Now pain is in hip)  Knee Pain  The incident occurred more than 1 week ago. There was no injury mechanism (played football). The pain is present in the left knee. The quality of the pain is described as aching. The pain is at a severity of 5/10. The pain is moderate. The pain has been fluctuating since onset. Pertinent negatives include no inability to bear weight, loss of motion, loss of sensation, muscle weakness, numbness or tingling. The symptoms are aggravated by movement. He has tried NSAIDs (injection) for the symptoms. The treatment provided moderate relief.  Back Pain This is a chronic problem. The current episode started more than 1 month ago. The problem occurs daily. The problem has been waxing and waning since onset. The pain is present in the lumbar spine and sacro-iliac. The quality of the pain is described as aching. The pain radiates to the left thigh. The pain is at a severity of 5/10. The pain is moderate. The symptoms are aggravated by bending (sitting while driving). Pertinent negatives include no abdominal pain, bladder incontinence, bowel incontinence, chest pain, dysuria, fever, headaches, numbness, paresis, paresthesias or tingling.    Lab Results  Component Value Date   CREATININE 1.04 12/11/2018   BUN 11 12/11/2018   NA 141 12/11/2018   K 5.0 12/11/2018   CL 101 12/11/2018   CO2 26 12/11/2018   Lab Results  Component Value Date   CHOL 161 12/11/2018   HDL 65 12/11/2018   LDLCALC 80 12/11/2018   TRIG 83 12/11/2018   CHOLHDL 2.2 12/02/2015   No results found for: TSH No results found for: HGBA1C Lab Results  Component Value Date   WBC 5.5 12/11/2018   HGB 14.1 12/11/2018   HCT 42.3 12/11/2018   MCV 93 12/11/2018   PLT 184 12/11/2018   No results found for: ALT, AST, GGT, ALKPHOS, BILITOT   Review of  Systems  Constitutional: Negative for chills and fever.  HENT: Negative for drooling, ear discharge, ear pain and sore throat.   Respiratory: Negative for cough, shortness of breath and wheezing.   Cardiovascular: Negative for chest pain, palpitations and leg swelling.  Gastrointestinal: Negative for abdominal pain, blood in stool, bowel incontinence, constipation, diarrhea and nausea.  Endocrine: Negative for polydipsia.  Genitourinary: Negative for bladder incontinence, dysuria, frequency, hematuria and urgency.  Musculoskeletal: Positive for back pain. Negative for myalgias and neck pain.  Skin: Negative for rash.  Allergic/Immunologic: Negative for environmental allergies.  Neurological: Negative for dizziness, tingling, numbness, headaches and paresthesias.  Hematological: Does not bruise/bleed easily.  Psychiatric/Behavioral: Negative for suicidal ideas. The patient is not nervous/anxious.     Patient Active Problem List   Diagnosis Date Noted  . Colon polyp 01/19/2019  . Prostate cancer screening 11/30/2014  . H/O coronary artery bypass surgery 11/06/2013  . Presence of aortocoronary bypass graft 11/06/2013  . Arteriosclerosis of autologous vein coronary artery bypass graft 10/14/2013  . Benign essential HTN 10/14/2013  . HLD (hyperlipidemia) 10/14/2013    Allergies  Allergen Reactions  . Other Other (See Comments)    IVP dye    Past Surgical History:  Procedure Laterality Date  . CARDIAC SURGERY     bypass  . CHOLECYSTECTOMY    . COLONOSCOPY  2006   cleared for [facility]  .  COLONOSCOPY WITH PROPOFOL N/A 01/07/2015   Procedure: COLONOSCOPY WITH PROPOFOL;  Surgeon: Hulen Luster, MD;  Location: [facility] ENDOSCOPY;  Service: Gastroenterology;  Laterality: N/A;  . CORONARY ARTERY BYPASS GRAFT      Social History   Tobacco Use  . Smoking status: Never Smoker  . Smokeless tobacco: Never Used  Substance Use Topics  . Alcohol use: Yes    Alcohol/week: 0.0 standard drinks  . Drug  use: No     Medication list has been reviewed and updated.  Current Meds  Medication Sig  . aspirin 325 MG tablet Take 1 tablet by mouth daily.  Marland Kitchen atorvastatin (LIPITOR) 80 MG tablet Take 1 tablet by mouth daily.  Marland Kitchen loratadine (ALLERGY) 10 MG tablet Take 10 mg by mouth daily.  . metoprolol succinate (TOPROL-XL) 50 MG 24 hr tablet Take 1 tablet by mouth daily.    PHQ 2/9 Scores 07/01/2019 12/11/2018 11/27/2017 11/27/2017  PHQ - 2 Score 0 0 0 0  PHQ- 9 Score 0 0 0 -    BP Readings from Last 3 Encounters:  07/01/19 120/80  05/11/19 130/80  12/11/18 118/70    Physical Exam Vitals and nursing note reviewed.  HENT:     Head: Normocephalic.     Right Ear: External ear normal.     Left Ear: External ear normal.     Nose: Nose normal.  Eyes:     General: No scleral icterus.       Right eye: No discharge.        Left eye: No discharge.     Conjunctiva/sclera: Conjunctivae normal.     Pupils: Pupils are equal, round, and reactive to light.  Neck:     Thyroid: No thyromegaly.     Vascular: No JVD.     Trachea: No tracheal deviation.  Cardiovascular:     Rate and Rhythm: Normal rate and regular rhythm.     Heart sounds: Normal heart sounds. No murmur. No friction rub. No gallop.   Pulmonary:     Effort: No respiratory distress.     Breath sounds: Normal breath sounds. No wheezing, rhonchi or rales.  Abdominal:     General: Bowel sounds are normal.     Palpations: Abdomen is soft. There is no mass.     Tenderness: There is no abdominal tenderness. There is no guarding or rebound.  Musculoskeletal:        General: Normal range of motion.     Cervical back: Normal range of motion and neck supple.     Lumbar back: Spasms present. No tenderness or bony tenderness. Normal range of motion. Negative right straight leg raise test and negative left straight leg raise test. No scoliosis.     Left knee: Tenderness present over the medial joint line.  Lymphadenopathy:     Cervical: No  cervical adenopathy.  Skin:    General: Skin is warm.     Findings: No rash.  Neurological:     Mental Status: He is alert and oriented to person, place, and time.     Cranial Nerves: No cranial nerve deficit.     Deep Tendon Reflexes: Reflexes are normal and symmetric.     Wt Readings from Last 3 Encounters:  07/01/19 230 lb (104.3 kg)  05/11/19 245 lb (111.1 kg)  12/11/18 235 lb (106.6 kg)    BP 120/80   Pulse 72   Ht 6\' 1"  (1.854 m)   Wt 230 lb (104.3 kg)   BMI 30.34  kg/m   Assessment and Plan:  1. Arthritis of left knee Chronic.  Controlled.  Recently injected by orthopedics.  Pain has continued and is only recently started to decrease.  Exam is consistent with arthritis of the knee.  Will resume meloxicam. - DG Lumbar Spine Complete; Future  2. Degenerative disc disease, lumbar New onset.  Episodic.  Stable.  Patient has had increasing lower back pain over the course of the last couple weeks.  Will obtain a lumbar spine x-ray to evaluate for degenerative disc disease.  Patient has been suggested to alternate weightbearing walking with cycling.  We will resume meloxicam.- DG Lumbar Spine Complete; Future

## 2019-09-02 ENCOUNTER — Encounter: Payer: Self-pay | Admitting: Family Medicine

## 2019-09-02 ENCOUNTER — Ambulatory Visit (INDEPENDENT_AMBULATORY_CARE_PROVIDER_SITE_OTHER): Payer: Medicare Other | Admitting: Family Medicine

## 2019-09-02 ENCOUNTER — Other Ambulatory Visit: Payer: Self-pay

## 2019-09-02 VITALS — BP 128/82 | HR 74 | Ht 73.0 in | Wt 239.0 lb

## 2019-09-02 DIAGNOSIS — M1710 Unilateral primary osteoarthritis, unspecified knee: Secondary | ICD-10-CM | POA: Diagnosis not present

## 2019-09-02 DIAGNOSIS — M7632 Iliotibial band syndrome, left leg: Secondary | ICD-10-CM

## 2019-09-02 NOTE — Progress Notes (Signed)
Date:  09/02/2019   Name:  Dustin Arnold   DOB:  10/05/1955   MRN:  [id number]   Chief Complaint: Hip Pain (left side X2-3 months hip is worse, bad at night, pain doesnt seem to go away  ) and Knee Pain (left side taking aleve, X2-3 months hard to raise leg up, hard to bend, today feeling better)  Hip Pain  The incident occurred more than 1 week ago. There was no injury mechanism (hiking involve). The pain is present in the left leg, left hip, left thigh and left knee. The quality of the pain is described as aching. The pain is moderate. The pain has been improving since onset. Pertinent negatives include no inability to bear weight or loss of motion. The symptoms are aggravated by weight bearing and movement. He has tried NSAIDs for the symptoms. The treatment provided moderate relief.  Knee Pain  Injury mechanism: remote injury. The pain is present in the left knee. The quality of the pain is described as aching. The pain is moderate. The pain has been fluctuating since onset. Pertinent negatives include no inability to bear weight or loss of motion. The symptoms are aggravated by weight bearing and movement. He has tried NSAIDs and ice for the symptoms.    Lab Results  Component Value Date   CREATININE 1.04 12/11/2018   BUN 11 12/11/2018   NA 141 12/11/2018   K 5.0 12/11/2018   CL 101 12/11/2018   CO2 26 12/11/2018   Lab Results  Component Value Date   CHOL 161 12/11/2018   HDL 65 12/11/2018   LDLCALC 80 12/11/2018   TRIG 83 12/11/2018   CHOLHDL 2.2 12/02/2015   No results found for: TSH No results found for: HGBA1C Lab Results  Component Value Date   WBC 5.5 12/11/2018   HGB 14.1 12/11/2018   HCT 42.3 12/11/2018   MCV 93 12/11/2018   PLT 184 12/11/2018   No results found for: ALT, AST, GGT, ALKPHOS, BILITOT   Review of Systems  Patient Active Problem List   Diagnosis Date Noted  . Colon polyp 01/19/2019  . Prostate cancer screening 11/30/2014  . H/O coronary  artery bypass surgery 11/06/2013  . Presence of aortocoronary bypass graft 11/06/2013  . Arteriosclerosis of autologous vein coronary artery bypass graft 10/14/2013  . Benign essential HTN 10/14/2013  . HLD (hyperlipidemia) 10/14/2013    Allergies  Allergen Reactions  . Other Other (See Comments)    IVP dye    Past Surgical History:  Procedure Laterality Date  . CARDIAC SURGERY     bypass  . CHOLECYSTECTOMY    . COLONOSCOPY  2006   cleared for [facility]  . COLONOSCOPY WITH PROPOFOL N/A 01/07/2015   Procedure: COLONOSCOPY WITH PROPOFOL;  Surgeon: Wallace Cullens, MD;  Location: [facility] ENDOSCOPY;  Service: Gastroenterology;  Laterality: N/A;  . CORONARY ARTERY BYPASS GRAFT      Social History   Tobacco Use  . Smoking status: Never Smoker  . Smokeless tobacco: Never Used  Substance Use Topics  . Alcohol use: Yes    Alcohol/week: 0.0 standard drinks  . Drug use: No     Medication list has been reviewed and updated.  Current Meds  Medication Sig  . aspirin 325 MG tablet Take 1 tablet by mouth daily.  Marland Kitchen atorvastatin (LIPITOR) 80 MG tablet Take 1 tablet by mouth daily.  Marland Kitchen loratadine (ALLERGY) 10 MG tablet Take 10 mg by mouth daily.  . meloxicam (MOBIC)  15 MG tablet Take 1 tablet (15 mg total) by mouth daily.  . metoprolol succinate (TOPROL-XL) 50 MG 24 hr tablet Take 1 tablet by mouth daily.    PHQ 2/9 Scores 09/02/2019 07/01/2019 12/11/2018 11/27/2017  PHQ - 2 Score 0 0 0 0  PHQ- 9 Score 1 0 0 0    GAD 7 : Generalized Anxiety Score 09/02/2019 07/01/2019  Nervous, Anxious, on Edge - 0  Control/stop worrying 0 0  Worry too much - different things 0 0  Trouble relaxing 0 0  Restless 0 0  Easily annoyed or irritable 0 0  Afraid - awful might happen 0 0  Total GAD 7 Score - 0  Anxiety Difficulty Not difficult at all -    BP Readings from Last 3 Encounters:  09/02/19 128/82  07/01/19 120/80  05/11/19 130/80    Physical Exam  Wt Readings from Last 3 Encounters:  09/02/19 239  lb (108.4 kg)  07/01/19 230 lb (104.3 kg)  05/11/19 245 lb (111.1 kg)    BP 128/82   Pulse 74   Ht 6\' 1"  (1.854 m)   Wt 239 lb (108.4 kg)   SpO2 98%   BMI 31.53 kg/m   Assessment and Plan: 1. Arthrosis of knee Chronic.  Recurrent.  Episodic and currently without pain.  Patient had a recent exacerbation with swelling giving away and discomfort in the knee after some hiking episodes.  [facility].  Exam is consistent with tenderness over the medial joint line.  Patient had a injection of steroid and there was some suggestion of a second stage injection of one of the newer agents.  Patient has referred to orthopedics for reevaluation and treatment.  In the meantime patient will continue meloxicam with Tylenol on a as needed basis. - Ambulatory referral to Orthopedic Surgery  2. Iliotibial band syndrome of left side New onset.  First episode.  Currently resolved.  Patient had tenderness and discomfort along the lateral aspect of his left thigh extending from his iliac crest to his left lateral knee.  There is tenderness noted.  This is consistent with iliotibial band and patient was encouraged to continue meloxicam with icing of this area as needed.

## 2019-09-02 NOTE — Patient Instructions (Signed)
Iliotibial Band Syndrome  Iliotibial band syndrome (ITBS) is a condition that often causes knee pain. It can also cause pain in the outside of your hip, thigh, and knee. The iliotibial band is a strip of tissue that runs from the outside of your hip and down your thigh to the outside of your knee. Repeatedly bending and straightening your knee can irritate the iliotibial band. What are the causes? This condition is caused by inflammation and irritation from the friction of the iliotibial band moving over the thigh bone (femur) when you repeatedly bend and straighten your knee. What increases the risk? This condition is more likely to develop in people who:  Frequently change elevation during their workouts.  Run very long distances.  Recently increased the length or intensity of their workouts.  Run downhill often, or just started running downhill.  Ride a bike very far or often. You may also be at greater risk if you start a new workout routine without first warming up or if you have a job that requires you to bend, squat, or climb frequently. What are the signs or symptoms? Symptoms of this condition include:  Pain along the outside of your knee that may be worse with activity, especially running or going up and down stairs.  A "snapping" sensation over your knee.  Swelling on the outside of your knee.  Pain or a feeling of tightness in your hip. How is this diagnosed? This condition is diagnosed based on your symptoms, medical history, and physical exam. You may also see a health care provider who specializes in reducing pain and increasing mobility (physical therapist). A physical therapist may do an exam to check your balance, movement, and way of walking or running (gait) to see whether the way you move could contribute to your injury. You may also have tests to measure your strength, flexibility, and range of motion. How is this treated? Treatment for this condition  includes:  Resting and limiting exercise.  Returning to activities gradually.  Doing range-of-motion and strengthening exercises (physical therapy) as told by your health care provider.  Including low-impact activities, such as swimming, in your exercise routine. Follow these instructions at home:  If directed, apply ice to the injured area. ? Put ice in a plastic bag. ? Place a towel between your skin and the bag. ? Leave the ice on for 20 minutes, 2-3 times per day.  Return to your normal activities as told by your health care provider. Ask your health care provider what activities are safe for you.  Keep all follow-up visits with your health care provider. This is important. Contact a health care provider if:  Your pain does not improve or gets worse despite treatment. This information is not intended to replace advice given to you by your health care provider. Make sure you discuss any questions you have with your health care provider. Document Revised: 02/08/2017 Document Reviewed: 03/30/2016 [employer] Patient Education  2020 [employer]  

## 2019-09-15 DIAGNOSIS — M7062 Trochanteric bursitis, left hip: Secondary | ICD-10-CM | POA: Diagnosis not present

## 2019-09-15 DIAGNOSIS — M1712 Unilateral primary osteoarthritis, left knee: Secondary | ICD-10-CM | POA: Diagnosis not present

## 2019-11-02 DIAGNOSIS — E7800 Pure hypercholesterolemia, unspecified: Secondary | ICD-10-CM | POA: Diagnosis not present

## 2019-11-02 DIAGNOSIS — I25810 Atherosclerosis of coronary artery bypass graft(s) without angina pectoris: Secondary | ICD-10-CM | POA: Diagnosis not present

## 2019-11-02 DIAGNOSIS — I10 Essential (primary) hypertension: Secondary | ICD-10-CM | POA: Diagnosis not present

## 2019-11-09 DIAGNOSIS — Z0120 Encounter for dental examination and cleaning without abnormal findings: Secondary | ICD-10-CM | POA: Diagnosis not present

## 2019-12-17 ENCOUNTER — Encounter: Payer: Self-pay | Admitting: Family Medicine

## 2019-12-17 ENCOUNTER — Ambulatory Visit (INDEPENDENT_AMBULATORY_CARE_PROVIDER_SITE_OTHER): Payer: Medicare Other | Admitting: Family Medicine

## 2019-12-17 ENCOUNTER — Other Ambulatory Visit: Payer: Self-pay

## 2019-12-17 VITALS — BP 130/80 | HR 80 | Ht 73.0 in | Wt 242.0 lb

## 2019-12-17 DIAGNOSIS — Z23 Encounter for immunization: Secondary | ICD-10-CM | POA: Diagnosis not present

## 2019-12-17 DIAGNOSIS — Z0000 Encounter for general adult medical examination without abnormal findings: Secondary | ICD-10-CM

## 2019-12-17 DIAGNOSIS — E785 Hyperlipidemia, unspecified: Secondary | ICD-10-CM

## 2019-12-17 DIAGNOSIS — Z1211 Encounter for screening for malignant neoplasm of colon: Secondary | ICD-10-CM

## 2019-12-17 DIAGNOSIS — I10 Essential (primary) hypertension: Secondary | ICD-10-CM

## 2019-12-17 LAB — HEMOCCULT GUIAC POC 1CARD (OFFICE)

## 2019-12-17 NOTE — Progress Notes (Signed)
Date:  12/17/2019   Name:  Dustin Arnold   DOB:  05-21-1955   MRN:  [id number]   Chief Complaint: Annual Exam, Flu Vaccine, and pneum 13  Patient is a 64 year old male who presents for a comprehensive physical exam. The patient reports the following problems: none. Health maintenance has been reviewed pneum 13/ influenza.   Lab Results  Component Value Date   CREATININE 1.04 12/11/2018   BUN 11 12/11/2018   NA 141 12/11/2018   K 5.0 12/11/2018   CL 101 12/11/2018   CO2 26 12/11/2018   Lab Results  Component Value Date   CHOL 161 12/11/2018   HDL 65 12/11/2018   LDLCALC 80 12/11/2018   TRIG 83 12/11/2018   CHOLHDL 2.2 12/02/2015   No results found for: TSH No results found for: HGBA1C Lab Results  Component Value Date   WBC 5.5 12/11/2018   HGB 14.1 12/11/2018   HCT 42.3 12/11/2018   MCV 93 12/11/2018   PLT 184 12/11/2018   No results found for: ALT, AST, GGT, ALKPHOS, BILITOT   Review of Systems  Constitutional: Negative for chills and fever.  HENT: Negative for drooling, ear discharge, ear pain and sore throat.   Respiratory: Negative for cough, shortness of breath and wheezing.   Cardiovascular: Negative for chest pain, palpitations and leg swelling.  Gastrointestinal: Negative for abdominal pain, blood in stool, constipation, diarrhea and nausea.  Endocrine: Negative for polydipsia.  Genitourinary: Negative for dysuria, frequency, hematuria and urgency.  Musculoskeletal: Negative for back pain, myalgias and neck pain.  Skin: Negative for rash.  Allergic/Immunologic: Negative for environmental allergies.  Neurological: Negative for dizziness and headaches.  Hematological: Does not bruise/bleed easily.  Psychiatric/Behavioral: Negative for suicidal ideas. The patient is not nervous/anxious.     Patient Active Problem List   Diagnosis Date Noted  . Colon polyp 01/19/2019  . Prostate cancer screening 11/30/2014  . H/O coronary artery bypass surgery  11/06/2013  . Presence of aortocoronary bypass graft 11/06/2013  . Arteriosclerosis of autologous vein coronary artery bypass graft 10/14/2013  . Benign essential HTN 10/14/2013  . HLD (hyperlipidemia) 10/14/2013    Allergies  Allergen Reactions  . Other Other (See Comments)    IVP dye    Past Surgical History:  Procedure Laterality Date  . CARDIAC SURGERY     bypass  . CHOLECYSTECTOMY    . COLONOSCOPY  2006   [facility]  . COLONOSCOPY WITH PROPOFOL N/A 01/07/2015   Procedure: COLONOSCOPY WITH PROPOFOL;  Surgeon: Wallace CullensPaul Y Oh, MD;  [facility] END[facility];  Service: Gastroenterology;  Laterality: N/A;  . CORONARY ARTERY BYPASS GRAFT      Social History   Tobacco Use  . Smoking status: Never Smoker  . Smokeless tobacco: Never Used  Substance Use Topics  . Alcohol use: Yes    Alcohol/week: 0.0 standard drinks  . Drug use: No     Medication list has been reviewed and updated.  Current Meds  Medication Sig  . aspirin 81 MG EC tablet Take 1 tablet by mouth daily.   Marland Kitchen. atorvastatin (LIPITOR) 80 MG tablet Take 1 tablet by mouth daily.  Marland Kitchen. loratadine (ALLERGY) 10 MG tablet Take 10 mg by mouth daily.  . metoprolol succinate (TOPROL-XL) 50 MG 24 hr tablet Take 1 tablet by mouth daily.    PHQ 2/9 Scores 12/17/2019 09/02/2019 07/01/2019 12/11/2018  PHQ - 2 Score 0 0 0 0  PHQ- 9 Score 0 1 0 0  GAD 7 : Generalized Anxiety Score 12/17/2019 09/02/2019 07/01/2019  Nervous, Anxious, on Edge 0 - 0  Control/stop worrying 0 0 0  Worry too much - different things 0 0 0  Trouble relaxing 0 0 0  Restless 0 0 0  Easily annoyed or irritable 0 0 0  Afraid - awful might happen 0 0 0  Total GAD 7 Score 0 - 0  Anxiety Difficulty - Not difficult at all -    BP Readings from Last 3 Encounters:  12/17/19 130/80  09/02/19 128/82  07/01/19 120/80    Physical Exam Vitals and nursing note reviewed.  Constitutional:      Appearance: He is well-developed, well-groomed and normal weight.   HENT:     Head: Normocephalic.     Jaw: There is normal jaw occlusion.     Right Ear: Hearing, tympanic membrane, ear canal and external ear normal.     Left Ear: Hearing, tympanic membrane, ear canal and external ear normal.     Nose: Nose normal.     Mouth/Throat:     Lips: Pink.     Mouth: Mucous membranes are moist.     Dentition: Normal dentition.     Palate: No mass.     Pharynx: Oropharynx is clear. Uvula midline.  Eyes:     General: Lids are normal. Vision grossly intact. Gaze aligned appropriately. No scleral icterus.       Right eye: No foreign body, discharge or hordeolum.        Left eye: No foreign body, discharge or hordeolum.     Conjunctiva/sclera: Conjunctivae normal.     Pupils: Pupils are equal, round, and reactive to light.     Funduscopic exam:    Right eye: Red reflex present.        Left eye: Red reflex present.    Slit lamp exam:    Right eye: Anterior chamber quiet.     Left eye: Anterior chamber quiet.  Neck:     Thyroid: No thyroid mass, thyromegaly or thyroid tenderness.     Vascular: Normal carotid pulses. No carotid bruit, hepatojugular reflux or JVD.     Trachea: Trachea normal. No tracheal deviation.  Cardiovascular:     Rate and Rhythm: Normal rate and regular rhythm.     Chest Wall: PMI is not displaced. No thrill.     Pulses: Normal pulses.          Carotid pulses are 2+ on the right side and 2+ on the left side.      Radial pulses are 2+ on the right side and 2+ on the left side.       Femoral pulses are 2+ on the right side and 2+ on the left side.      Popliteal pulses are 2+ on the right side and 2+ on the left side.       Dorsalis pedis pulses are 2+ on the right side and 2+ on the left side.       Posterior tibial pulses are 2+ on the right side and 2+ on the left side.     Heart sounds: Normal heart sounds, S1 normal and S2 normal. No murmur heard.  No systolic murmur is present.  No diastolic murmur is present.  No friction rub. No  gallop. No S3 or S4 sounds.   Pulmonary:     Effort: Pulmonary effort is normal. No respiratory distress.     Breath sounds: Normal breath sounds and air  entry. No decreased breath sounds, wheezing, rhonchi or rales.  Chest:     Chest wall: No mass.     Breasts: Breasts are symmetrical.        Right: Normal.        Left: Normal.  Abdominal:     General: Abdomen is flat. Bowel sounds are normal.     Palpations: Abdomen is soft. There is no hepatomegaly, splenomegaly or mass.     Tenderness: There is no abdominal tenderness. There is no guarding or rebound.     Hernia: There is no hernia in the left inguinal area or right inguinal area.  Genitourinary:    Penis: Normal.      Testes: Normal.        Right: Mass not present.        Left: Mass not present.     Epididymis:     Right: Normal.     Left: Normal.     Prostate: Normal. Not enlarged, not tender and no nodules present.     Rectum: Normal. Guaiac result negative. No mass.  Musculoskeletal:        General: No tenderness. Normal range of motion.     Cervical back: Normal, full passive range of motion without pain, normal range of motion and neck supple.     Thoracic back: Normal.     Lumbar back: Normal.     Right lower leg: No edema.     Left lower leg: No edema.  Lymphadenopathy:     Head:     Right side of head: No submandibular adenopathy.     Left side of head: No submandibular adenopathy.     Cervical: No cervical adenopathy.     Right cervical: No superficial, deep or posterior cervical adenopathy.    Left cervical: No superficial, deep or posterior cervical adenopathy.     Upper Body:     Right upper body: No supraclavicular or axillary adenopathy.     Left upper body: No supraclavicular or axillary adenopathy.     Lower Body: No right inguinal adenopathy. No left inguinal adenopathy.  Skin:    General: Skin is warm.     Capillary Refill: Capillary refill takes less than 2 seconds.     Findings: No rash.   Neurological:     Mental Status: He is alert and oriented to person, place, and time.     Cranial Nerves: Cranial nerves are intact. No cranial nerve deficit.     Sensory: Sensation is intact.     Motor: Motor function is intact.     Deep Tendon Reflexes: Reflexes are normal and symmetric.     Reflex Scores:      Tricep reflexes are 2+ on the right side and 2+ on the left side.      Bicep reflexes are 2+ on the right side and 2+ on the left side.      Brachioradialis reflexes are 2+ on the right side and 2+ on the left side.      Patellar reflexes are 2+ on the right side and 2+ on the left side.      Achilles reflexes are 2+ on the right side and 2+ on the left side. Psychiatric:        Behavior: Behavior is cooperative.     Wt Readings from Last 3 Encounters:  12/17/19 242 lb (109.8 kg)  09/02/19 239 lb (108.4 kg)  07/01/19 230 lb (104.3 kg)    BP 130/80  Pulse 80   Ht 6\' 1"  (1.854 m)   Wt 242 lb (109.8 kg)   BMI 31.93 kg/m   Assessment and Plan: 1. Annual physical exam No subjective/objective concerns noted during history and physical exam.  Patient's chart was reviewed for previous encounters, most recent labs, most recent imaging, and care everywhere.Dustin Arnold is a 64 y.o. male who presents today for his Complete Annual Exam. He feels well. He reports exercising . He reports he is sleeping well. Immunizations are reviewed and recommendations provided.   Age appropriate screening tests are discussed. Counseling given for risk factor reduction interventions.  We will obtain CMP, lipid panel, CBC and PSA. - Comprehensive metabolic panel - Lipid Panel With LDL/HDL Ratio - CBC with Differential/Platelet - PSA  2. Need for immunization against influenza Discussed and administered. - Flu Vaccine QUAD High Dose(Fluad)  3. Colon cancer screening Guaiac and DRE done which were negative. - POCT occult blood stool  4. Hyperlipidemia, unspecified hyperlipidemia  type Chronic.  Controlled.  Stable.  5. Benign essential HTN Chronic.  Controlled.  Stable.  Blood pressure is 130/80  6. Need for pneumococcal vaccination Discussed and administered - Pneumococcal conjugate vaccine 13-valent

## 2019-12-18 LAB — COMPREHENSIVE METABOLIC PANEL
ALT: 44 IU/L (ref 0–44)
AST: 36 IU/L (ref 0–40)
Albumin/Globulin Ratio: 2 (ref 1.2–2.2)
Albumin: 4.3 g/dL (ref 3.8–4.8)
Alkaline Phosphatase: 61 IU/L (ref 44–121)
BUN/Creatinine Ratio: 12 (ref 10–24)
BUN: 14 mg/dL (ref 8–27)
Bilirubin Total: 0.7 mg/dL (ref 0.0–1.2)
CO2: 23 mmol/L (ref 20–29)
Calcium: 9.2 mg/dL (ref 8.6–10.2)
Chloride: 100 mmol/L (ref 96–106)
Creatinine, Ser: 1.18 mg/dL (ref 0.76–1.27)
GFR calc Af Amer: 74 mL/min/{1.73_m2} (ref >59)
GFR calc non Af Amer: 64 mL/min/{1.73_m2} (ref >59)
Globulin, Total: 2.1 g/dL (ref 1.5–4.5)
Glucose: 101 mg/dL — ABNORMAL HIGH (ref 65–99)
Potassium: 4.6 mmol/L (ref 3.5–5.2)
Sodium: 137 mmol/L (ref 134–144)
Total Protein: 6.4 g/dL (ref 6.0–8.5)

## 2019-12-18 LAB — CBC WITH DIFFERENTIAL/PLATELET
Basophils Absolute: 0 10*3/uL (ref 0.0–0.2)
Basos: 1 %
EOS (ABSOLUTE): 0.3 10*3/uL (ref 0.0–0.4)
Eos: 5 %
Hematocrit: 41.5 % (ref 37.5–51.0)
Hemoglobin: 13.9 g/dL (ref 13.0–17.7)
Immature Grans (Abs): 0 10*3/uL (ref 0.0–0.1)
Immature Granulocytes: 0 %
Lymphocytes Absolute: 1.9 10*3/uL (ref 0.7–3.1)
Lymphs: 30 %
MCH: 31.6 pg (ref 26.6–33.0)
MCHC: 33.5 g/dL (ref 31.5–35.7)
MCV: 94 fL (ref 79–97)
Monocytes Absolute: 0.6 10*3/uL (ref 0.1–0.9)
Monocytes: 10 %
Neutrophils Absolute: 3.6 10*3/uL (ref 1.4–7.0)
Neutrophils: 54 %
Platelets: 189 10*3/uL (ref 150–450)
RBC: 4.4 x10E6/uL (ref 4.14–5.80)
RDW: 12.7 % (ref 11.6–15.4)
WBC: 6.4 10*3/uL (ref 3.4–10.8)

## 2019-12-18 LAB — LIPID PANEL WITH LDL/HDL RATIO
Cholesterol, Total: 148 mg/dL (ref 100–199)
HDL: 57 mg/dL (ref >39)
LDL Chol Calc (NIH): 75 mg/dL (ref 0–99)
LDL/HDL Ratio: 1.3 ratio (ref 0.0–3.6)
Triglycerides: 82 mg/dL (ref 0–149)
VLDL Cholesterol Cal: 16 mg/dL (ref 5–40)

## 2019-12-18 LAB — PSA: Prostate Specific Ag, Serum: 0.5 ng/mL (ref 0.0–4.0)

## 2020-01-12 ENCOUNTER — Encounter: Payer: Self-pay | Admitting: Family Medicine

## 2020-01-18 ENCOUNTER — Ambulatory Visit: Payer: 59

## 2020-01-18 ENCOUNTER — Encounter: Payer: Self-pay | Admitting: Podiatry

## 2020-01-18 ENCOUNTER — Other Ambulatory Visit: Payer: Self-pay

## 2020-01-18 ENCOUNTER — Ambulatory Visit (INDEPENDENT_AMBULATORY_CARE_PROVIDER_SITE_OTHER): Payer: Medicare Other | Admitting: Podiatry

## 2020-01-18 DIAGNOSIS — M778 Other enthesopathies, not elsewhere classified: Secondary | ICD-10-CM | POA: Diagnosis not present

## 2020-01-18 NOTE — Progress Notes (Signed)
He presents today for follow-up of capsulitis second metatarsophalangeal joint right foot.  States that he started back up hurting again but as luck would have it does not bother me today at all  Objective: Vital signs are stable alert and oriented x3.  There is no erythema edema cellulitis drainage or odor still has some tenderness on end range of motion of the second metatarsophalangeal joint he also has flexor deformity at the PIPJ consistent with osteoarthritic change change of the second toe and dorsal contraction of the second toe at the metatarsophalangeal joint.  Assessment: Capsulitis and hammertoe deformity second metatarsophalangeal joint right foot.  Plan: Discussed etiology pathology conservative surgical therapies.  At this point I offered him an injection he declined.  We also discussed appropriate shoe gear stiff soled shoes physical therapy etc. he understands this is amenable to it and will follow up with [place] as needed.

## 2020-04-25 DIAGNOSIS — Z951 Presence of aortocoronary bypass graft: Secondary | ICD-10-CM | POA: Diagnosis not present

## 2020-04-25 DIAGNOSIS — E7800 Pure hypercholesterolemia, unspecified: Secondary | ICD-10-CM | POA: Diagnosis not present

## 2020-04-25 DIAGNOSIS — I10 Essential (primary) hypertension: Secondary | ICD-10-CM | POA: Diagnosis not present

## 2020-04-25 DIAGNOSIS — I25810 Atherosclerosis of coronary artery bypass graft(s) without angina pectoris: Secondary | ICD-10-CM | POA: Diagnosis not present

## 2020-06-13 ENCOUNTER — Encounter: Payer: Self-pay | Admitting: Family Medicine

## 2020-06-16 ENCOUNTER — Encounter: Payer: Self-pay | Admitting: Family Medicine

## 2020-06-16 ENCOUNTER — Other Ambulatory Visit: Payer: Self-pay

## 2020-06-16 ENCOUNTER — Ambulatory Visit (INDEPENDENT_AMBULATORY_CARE_PROVIDER_SITE_OTHER): Payer: Medicare Other | Admitting: Family Medicine

## 2020-06-16 VITALS — BP 130/80 | HR 64 | Ht 73.0 in | Wt 229.0 lb

## 2020-06-16 DIAGNOSIS — M5136 Other intervertebral disc degeneration, lumbar region: Secondary | ICD-10-CM | POA: Diagnosis not present

## 2020-06-16 DIAGNOSIS — E785 Hyperlipidemia, unspecified: Secondary | ICD-10-CM | POA: Diagnosis not present

## 2020-06-16 NOTE — Patient Instructions (Signed)
Why follow it? Research shows. . Those who follow the Mediterranean diet have a reduced risk of heart disease  . The diet is associated with a reduced incidence of Parkinson's and Alzheimer's diseases . People following the diet may have longer life expectancies and lower rates of chronic diseases  . The Dietary Guidelines for Americans recommends the Mediterranean diet as an eating plan to promote health and prevent disease  What Is the Mediterranean Diet?  . Healthy eating plan based on typical foods and recipes of Mediterranean-style cooking . The diet is primarily a plant based diet; these foods should make up a majority of meals   Starches - Plant based foods should make up a majority of meals - They are an important sources of vitamins, minerals, energy, antioxidants, and fiber - Choose whole grains, foods high in fiber and minimally processed items  - Typical grain sources include wheat, oats, barley, corn, brown Launer, bulgar, farro, millet, polenta, couscous  - Various types of beans include chickpeas, lentils, fava beans, black beans, white beans   Fruits  Veggies - Large quantities of antioxidant rich fruits & veggies; 6 or more servings  - Vegetables can be eaten raw or lightly drizzled with oil and cooked  - Vegetables common to the traditional Mediterranean Diet include: artichokes, arugula, beets, broccoli, brussel sprouts, cabbage, carrots, celery, collard greens, cucumbers, eggplant, kale, leeks, lemons, lettuce, mushrooms, okra, onions, peas, peppers, potatoes, pumpkin, radishes, rutabaga, shallots, spinach, sweet potatoes, turnips, zucchini - Fruits common to the Mediterranean Diet include: apples, apricots, avocados, cherries, clementines, dates, figs, grapefruits, grapes, melons, nectarines, oranges, peaches, pears, pomegranates, strawberries, tangerines  Fats - Replace butter and margarine with healthy oils, such as olive oil, canola oil, and tahini  - Limit nuts to no  more than a handful a day  - Nuts include walnuts, almonds, pecans, pistachios, pine nuts  - Limit or avoid candied, honey roasted or heavily salted nuts - Olives are central to the Mediterranean diet - can be eaten whole or used in a variety of dishes   Meats Protein - Limiting red meat: no more than a few times a month - When eating red meat: choose lean cuts and keep the portion to the size of deck of cards - Eggs: approx. 0 to 4 times a week  - Fish and lean poultry: at least 2 a week  - Healthy protein sources include, chicken, turkey, lean beef, lamb - Increase intake of seafood such as tuna, salmon, trout, mackerel, shrimp, scallops - Avoid or limit high fat processed meats such as sausage and bacon  Dairy - Include moderate amounts of low fat dairy products  - Focus on healthy dairy such as fat free yogurt, skim milk, low or reduced fat cheese - Limit dairy products higher in fat such as whole or 2% milk, cheese, ice cream  Alcohol - Moderate amounts of red wine is ok  - No more than 5 oz daily for women (all ages) and men older than age 65  - No more than 10 oz of wine daily for men younger than 65  Other - Limit sweets and other desserts  - Use herbs and spices instead of salt to flavor foods  - Herbs and spices common to the traditional Mediterranean Diet include: basil, bay leaves, chives, cloves, cumin, fennel, garlic, lavender, marjoram, mint, oregano, parsley, pepper, rosemary, sage, savory, sumac, tarragon, thyme   It's not just a diet, it's a lifestyle:  . The Mediterranean diet includes   lifestyle factors typical of those in the region  . Foods, drinks and meals are best eaten with others and savored . Daily physical activity is important for overall good health . This could be strenuous exercise like running and aerobics . This could also be more leisurely activities such as walking, housework, yard-work, or taking the stairs . Moderation is the key; a balanced and  healthy diet accommodates most foods and drinks . Consider portion sizes and frequency of consumption of certain foods   Meal Ideas & Options:  . Breakfast:  o Whole wheat toast or whole wheat English muffins with peanut butter & hard boiled egg o Steel cut oats topped with apples & cinnamon and skim milk  o Fresh fruit: banana, strawberries, melon, berries, peaches  o Smoothies: strawberries, bananas, greek yogurt, peanut butter o Low fat greek yogurt with blueberries and granola  o Egg white omelet with spinach and mushrooms o Breakfast couscous: whole wheat couscous, apricots, skim milk, cranberries  . Sandwiches:  o Hummus and grilled vegetables (peppers, zucchini, squash) on whole wheat bread   o Grilled chicken on whole wheat pita with lettuce, tomatoes, cucumbers or tzatziki  o Tuna salad on whole wheat bread: tuna salad made with greek yogurt, olives, red peppers, capers, green onions o Garlic rosemary lamb pita: lamb sauted with garlic, rosemary, salt & pepper; add lettuce, cucumber, greek yogurt to pita - flavor with lemon juice and black pepper  . Seafood:  o Mediterranean grilled salmon, seasoned with garlic, basil, parsley, lemon juice and black pepper o Shrimp, lemon, and spinach whole-grain pasta salad made with low fat greek yogurt  o Seared scallops with lemon orzo  o Seared tuna steaks seasoned salt, pepper, coriander topped with tomato mixture of olives, tomatoes, olive oil, minced garlic, parsley, green onions and cappers  . Meats:  o Herbed greek chicken salad with kalamata olives, cucumber, feta  o Red bell peppers stuffed with spinach, bulgur, lean ground beef (or lentils) & topped with feta   o Kebabs: skewers of chicken, tomatoes, onions, zucchini, squash  o Turkey burgers: made with red onions, mint, dill, lemon juice, feta cheese topped with roasted red peppers . Vegetarian o Cucumber salad: cucumbers, artichoke hearts, celery, red onion, feta cheese, tossed in  olive oil & lemon juice  o Hummus and whole grain pita points with a greek salad (lettuce, tomato, feta, olives, cucumbers, red onion) o Lentil soup with celery, carrots made with vegetable broth, garlic, salt and pepper  o Tabouli salad: parsley, bulgur, mint, scallions, cucumbers, tomato, radishes, lemon juice, olive oil, salt and pepper.      

## 2020-06-16 NOTE — Progress Notes (Signed)
Date:  06/16/2020   Name:  Dustin Arnold   DOB:  07/10/1955   MRN:  [id number]   Chief Complaint: Hypertension (Sees Dr Darrold Junker for HTN- just a bp check up)  Hypertension This is a chronic problem. The current episode started more than 1 year ago. The problem has been gradually improving since onset. The problem is controlled. Pertinent negatives include no anxiety, blurred vision, chest pain, headaches, malaise/fatigue, neck pain, orthopnea, palpitations, peripheral edema, PND, shortness of breath or sweats. There are no associated agents to hypertension. Risk factors for coronary artery disease include dyslipidemia. Past treatments include beta blockers. The current treatment provides moderate improvement.  Back Pain This is a chronic problem. The current episode started more than 1 year ago. The problem occurs intermittently. The problem has been waxing and waning since onset. The pain is present in the lumbar spine. The quality of the pain is described as aching. Pertinent negatives include no abdominal pain, chest pain, dysuria, fever or headaches.    Lab Results  Component Value Date   CREATININE 1.18 12/17/2019   BUN 14 12/17/2019   NA 137 12/17/2019   K 4.6 12/17/2019   CL 100 12/17/2019   CO2 23 12/17/2019   Lab Results  Component Value Date   CHOL 148 12/17/2019   HDL 57 12/17/2019   LDLCALC 75 12/17/2019   TRIG 82 12/17/2019   CHOLHDL 2.2 12/02/2015   No results found for: TSH No results found for: HGBA1C Lab Results  Component Value Date   WBC 6.4 12/17/2019   HGB 13.9 12/17/2019   HCT 41.5 12/17/2019   MCV 94 12/17/2019   PLT 189 12/17/2019   Lab Results  Component Value Date   ALT 44 12/17/2019   AST 36 12/17/2019   ALKPHOS 61 12/17/2019   BILITOT 0.7 12/17/2019     Review of Systems  Constitutional: Negative for chills, fever and malaise/fatigue.  HENT: Negative for drooling, ear discharge, ear pain and sore throat.   Eyes: Negative for blurred  vision.  Respiratory: Negative for cough, shortness of breath and wheezing.   Cardiovascular: Negative for chest pain, palpitations, orthopnea, leg swelling and PND.  Gastrointestinal: Negative for abdominal pain, blood in stool, constipation, diarrhea and nausea.  Endocrine: Negative for polydipsia.  Genitourinary: Negative for dysuria, frequency, hematuria and urgency.  Musculoskeletal: Negative for back pain, myalgias and neck pain.  Skin: Negative for rash.  Allergic/Immunologic: Negative for environmental allergies.  Neurological: Negative for dizziness and headaches.  Hematological: Does not bruise/bleed easily.  Psychiatric/Behavioral: Negative for suicidal ideas. The patient is not nervous/anxious.     Patient Active Problem List   Diagnosis Date Noted  . Colon polyp 01/19/2019  . Prostate cancer screening 11/30/2014  . H/O coronary artery bypass surgery 11/06/2013  . Presence of aortocoronary bypass graft 11/06/2013  . Arteriosclerosis of autologous vein coronary artery bypass graft 10/14/2013  . Benign essential HTN 10/14/2013  . HLD (hyperlipidemia) 10/14/2013    Allergies  Allergen Reactions  . Other Other (See Comments)    IVP dye    Past Surgical History:  Procedure Laterality Date  . CARDIAC SURGERY     bypass  . CHOLECYSTECTOMY    . COLONOSCOPY  2006   cleared for [facility]  . COLONOSCOPY WITH PROPOFOL N/A 01/07/2015   Procedure: COLONOSCOPY WITH PROPOFOL;  Surgeon: Wallace Cullens, MD;  Location: [facility] ENDOSCOPY;  Service: Gastroenterology;  Laterality: N/A;  . CORONARY ARTERY BYPASS GRAFT  Social History   Tobacco Use  . Smoking status: Never Smoker  . Smokeless tobacco: Never Used  Substance Use Topics  . Alcohol use: Yes    Alcohol/week: 0.0 standard drinks  . Drug use: No     Medication list has been reviewed and updated.  Current Meds  Medication Sig  . aspirin 81 MG EC tablet Take 1 tablet by mouth daily.   Marland Kitchen atorvastatin (LIPITOR) 80 MG tablet  Take 1 tablet by mouth daily.  Marland Kitchen loratadine (CLARITIN) 10 MG tablet Take 10 mg by mouth daily. otc  . metoprolol succinate (TOPROL-XL) 50 MG 24 hr tablet Take 1 tablet by mouth daily.  . Multiple Vitamins-Minerals (MULTIVITAMIN MEN 50+ PO) Take by mouth.    PHQ 2/9 Scores 12/17/2019 09/02/2019 07/01/2019 12/11/2018  PHQ - 2 Score 0 0 0 0  PHQ- 9 Score 0 1 0 0    GAD 7 : Generalized Anxiety Score 12/17/2019 09/02/2019 07/01/2019  Nervous, Anxious, on Edge 0 - 0  Control/stop worrying 0 0 0  Worry too much - different things 0 0 0  Trouble relaxing 0 0 0  Restless 0 0 0  Easily annoyed or irritable 0 0 0  Afraid - awful might happen 0 0 0  Total GAD 7 Score 0 - 0  Anxiety Difficulty - Not difficult at all -    BP Readings from Last 3 Encounters:  06/16/20 130/80  12/17/19 130/80  09/02/19 128/82    Physical Exam Vitals and nursing note reviewed.  HENT:     Head: Normocephalic.     Right Ear: Tympanic membrane and external ear normal.     Left Ear: Tympanic membrane and external ear normal.     Nose: Nose normal.  Eyes:     General: No scleral icterus.       Right eye: No discharge.        Left eye: No discharge.     Conjunctiva/sclera: Conjunctivae normal.     Pupils: Pupils are equal, round, and reactive to light.  Neck:     Thyroid: No thyromegaly.     Vascular: No JVD.     Trachea: No tracheal deviation.  Cardiovascular:     Rate and Rhythm: Normal rate and regular rhythm.     Heart sounds: Normal heart sounds. No murmur heard. No friction rub. No gallop.   Pulmonary:     Effort: No respiratory distress.     Breath sounds: Normal breath sounds. No wheezing, rhonchi or rales.  Abdominal:     General: Bowel sounds are normal.     Palpations: Abdomen is soft. There is no mass.     Tenderness: There is no abdominal tenderness. There is no guarding or rebound.  Musculoskeletal:        General: No tenderness. Normal range of motion.     Cervical back: Normal range of  motion and neck supple.  Lymphadenopathy:     Cervical: No cervical adenopathy.  Skin:    General: Skin is warm.     Findings: No rash.  Neurological:     Mental Status: He is alert and oriented to person, place, and time.     Cranial Nerves: No cranial nerve deficit.     Deep Tendon Reflexes: Reflexes are normal and symmetric.     Wt Readings from Last 3 Encounters:  06/16/20 229 lb (103.9 kg)  12/17/19 242 lb (109.8 kg)  09/02/19 239 lb (108.4 kg)    BP 130/80  Pulse 64   Ht 6\' 1"  (1.854 m)   Wt 229 lb (103.9 kg)   BMI 30.21 kg/m   Assessment and Plan:  1. Hyperlipidemia, unspecified hyperlipidemia type Followed by Dr. .  Is on statin agent.  Low-cholesterol diet suggested.  2. Degenerative disc disease, lumbar Patient has episodic degenerative disc disease particularly with long episodes of sitting.  Patient has used meloxicam in the past but is fine with taking Tylenol at this time.  Patient is also been encouraged to continue low impact exercise such as walking.

## 2020-08-01 ENCOUNTER — Other Ambulatory Visit: Payer: Self-pay

## 2020-08-01 ENCOUNTER — Ambulatory Visit (INDEPENDENT_AMBULATORY_CARE_PROVIDER_SITE_OTHER): Payer: Medicare Other

## 2020-08-01 VITALS — BP 122/62 | HR 67 | Temp 97.9°F | Resp 16 | Ht 73.0 in | Wt 229.4 lb

## 2020-08-01 DIAGNOSIS — Z0000 Encounter for general adult medical examination without abnormal findings: Secondary | ICD-10-CM

## 2020-08-01 NOTE — Progress Notes (Signed)
Subjective:   Dustin Arnold is a 65 y.o. male who presents for an Initial Medicare Annual Wellness Visit.  Review of Systems     Cardiac Risk Factors include: advanced age (>17men, >38 women);male gender;dyslipidemia;hypertension     Objective:    Today's Vitals   08/01/20 0927  BP: 122/62  Pulse: 67  Resp: 16  Temp: 97.9 F (36.6 C)  TempSrc: Oral  SpO2: 97%  Weight: 229 lb 6.4 oz (104.1 kg)  Height: 6\' 1"  (1.854 m)   Body mass index is 30.27 kg/m.  Advanced Directives 08/01/2020 05/18/2017 01/07/2015 11/30/2014  Does Patient Have a Medical Advance Directive? Yes Yes Yes Yes  Type of 12/02/2014 of [facility];Living will Living will - Healthcare Power of [place];Living will  Copy of Healthcare Power of Attorney in Chart? No - copy requested - - No - copy requested    Current Medications (verified) Outpatient Encounter Medications as of 08/01/2020  Medication Sig  . aspirin 81 MG EC tablet Take 1 tablet by mouth daily.   08/03/2020 atorvastatin (LIPITOR) 80 MG tablet Take 1 tablet by mouth daily.  Marland Kitchen loratadine (CLARITIN) 10 MG tablet Take 10 mg by mouth daily. otc  . metoprolol succinate (TOPROL-XL) 50 MG 24 hr tablet Take 1 tablet by mouth daily.  . Multiple Vitamins-Minerals (MULTIVITAMIN MEN 50+ PO) Take by mouth.   No facility-administered encounter medications on file as of 08/01/2020.    Allergies (verified) Other   History: Past Medical History:  Diagnosis Date  . Coronary artery disease   . Hyperlipemia   . Hypertension    Past Surgical History:  Procedure Laterality Date  . CARDIAC SURGERY     bypass  . CHOLECYSTECTOMY    . COLONOSCOPY  2006   cleared for [facility]  . COLONOSCOPY WITH PROPOFOL N/A 01/07/2015   Procedure: COLONOSCOPY WITH PROPOFOL;  Surgeon: 01/09/2015, MD;  Location[facility] ENDOSCOPY;  Service: Gastroenterology;  Laterality: N/A;  . CORONARY ARTERY BYPASS GRAFT     Family History  Problem Relation Age of Onset  . Cancer  Father        esophageal   Social History   Socioeconomic History  . Marital status: Married    Spouse name: Not on file  . Number of children: 1  . Years of education: Not on file  . Highest education level: Master's degree (e.g., MA, MS, MEng, MEd, MSW, MBA)  Occupational History  . Occupation: retired  Tobacco Use  . Smoking status: Never Smoker  . Smokeless tobacco: Never Used  Vaping Use  . Vaping Use: Never used  Substance and Sexual Activity  . Alcohol use: Yes    Alcohol/week: 0.0 standard drinks  . Drug use: No  . Sexual activity: Yes  Other Topics Concern  . Not on file  Social History Narrative  . Not on file   Social Determinants of Health   Financial Resource Strain: Low Risk   . Difficulty of Paying Living Expenses: Not hard at all  Food Insecurity: No Food Insecurity  . Worried About [facility] in the Last Year: Never true  . Ran Out of Food in the Last Year: Never true  Transportation Needs: No Transportation Needs  . Lack of Transportation (Medical): No  . Lack of Transportation (Non-Medical): No  Physical Activity: Sufficiently Active  . Days of Exercise per Week: 3 days  . Minutes of Exercise per Session: 120 min  Stress: No Stress Concern Present  .  Feeling of Stress : Not at all  Social Connections: Socially Integrated  . Frequency of Communication with Friends and Family: More than three times a week  . Frequency of Social Gatherings with Friends and Family: Three times a week  . Attends Religious Services: More than 4 times per year  . Active Member of Clubs or Organizations: Yes  . Attends Banker Meetings: More than 4 times per year  . Marital Status: Married    Tobacco Counseling Counseling given: Not Answered   Clinical Intake:  Pre-visit preparation completed: Yes  Pain : No/denies pain     BMI - recorded: 30.27 Nutritional Status: BMI > 30  Obese Nutritional Risks: None Diabetes: No  How often do  you need to have someone help you when you read instructions, pamphlets, or other written materials from your doctor or pharmacy?: 1 - Never    Interpreter Needed?: No  Information entered by :: Reather Littler LPN   Activities of Daily Living In your present state of health, do you have any difficulty performing the following activities: 08/01/2020 06/16/2020  Hearing? N N  Comment declines hearing aids -  Vision? N N  Difficulty concentrating or making decisions? N N  Walking or climbing stairs? N N  Dressing or bathing? N N  Doing errands, shopping? N N  Preparing Food and eating ? N -  Using the Toilet? N -  In the past six months, have you accidently leaked urine? N -  Do you have problems with loss of bowel control? N -  Managing your Medications? N -  Managing your Finances? N -  Housekeeping or managing your Housekeeping? N -  Some recent data might be hidden    Patient Care Team: Duanne Limerick, MD as PCP - General (Family Medicine) Marcina Millard, MD as Consulting Physician (Cardiology)  Indicate any recent Medical Services you may have received from other than [facility] providers in the past year (date may be approximate).     Assessment:   This is a routine wellness examination for [facility].  Hearing/Vision screen  Hearing Screening   125Hz  250Hz  500Hz  1000Hz  2000Hz  3000Hz  4000Hz  6000Hz  8000Hz   Right ear:           Left ear:           Comments: Pt denies hearing difficulty  Vision Screening Comments: Annual vision screenings done at [facility]; due for exam  Dietary issues and exercise activities discussed: Current Exercise Habits: Home exercise routine, Type of exercise: Other - see comments (hiking), Time (Minutes): > 60, Frequency (Times/Week): 3, Weekly Exercise (Minutes/Week): 0, Intensity: Moderate, Exercise limited by: None identified  Goals Addressed            This Visit's Progress   . Patient Stated       Patient states he would like to  maintain health, activity level and traveling.       Depression Screen PHQ 2/9 Scores 08/01/2020 12/17/2019 09/02/2019 07/01/2019 12/11/2018 11/27/2017 11/27/2017  PHQ - 2 Score 0 0 0 0 0 0 0  PHQ- 9 Score - 0 1 0 0 0 -    Fall Risk Fall Risk  08/01/2020 12/17/2019 09/02/2019 07/01/2019 11/27/2017  Falls in the past year? 0 0 0 0 No  Number falls in past yr: 0 - - - -  Injury with Fall? 0 - - - -  Risk for fall due to : No Fall Risks - No Fall Risks - -  Follow up Falls prevention discussed Falls evaluation completed Falls evaluation completed Falls evaluation completed -    FALL RISK PREVENTION PERTAINING TO THE HOME:  Any stairs in or around the home? Yes  If so, are there any without handrails? No  Home free of loose throw rugs in walkways, pet beds, electrical cords, etc? Yes  Adequate lighting in your home to reduce risk of falls? Yes   ASSISTIVE DEVICES UTILIZED TO PREVENT FALLS:  Life alert? No  Use of a cane, walker or w/c? No  Grab bars in the bathroom? No  Shower chair or bench in shower? No  Elevated toilet seat or a handicapped toilet? No   TIMED UP AND GO:  Was the test performed? Yes .  Length of time to ambulate 10 feet: 5 sec.   Gait steady and fast without use of assistive device  Cognitive Function: Normal cognitive status assessed by direct observation by this Nurse Health Advisor. No abnormalities found.          Immunizations Immunization History  Administered Date(s) Administered  . Fluad Quad(high Dose 65+) 12/17/2019  . Influenza,inj,Quad PF,6+ Mos 11/30/2014, 11/27/2017, 12/11/2018  . PFIZER(Purple Top)SARS-COV-2 Vaccination 06/09/2019, 06/23/2019, 12/25/2019  . Pneumococcal Conjugate-13 12/17/2019  . Pneumococcal Polysaccharide-23 11/30/2014  . Tdap 11/27/2017    TDAP status: Up to date  Flu Vaccine status: Up to date  Pneumococcal vaccine status: Up to date  Covid-19 vaccine status: Completed vaccines  Qualifies for Shingles Vaccine? Yes    Zostavax completed No   Shingrix Completed?: Yes - need record from [facility].   Screening Tests Health Maintenance  Topic Date Due  . COLONOSCOPY (Pts 45-59yrs Insurance coverage will need to be confirmed)  01/07/2020  . INFLUENZA VACCINE  10/10/2020  . PNA vac Low Risk Adult (2 of 2 - PPSV23) 12/16/2020  . TETANUS/TDAP  11/28/2027  . COVID-19 Vaccine  Completed  . Hepatitis C Screening  Completed  . HPV VACCINES  Aged Out    Health Maintenance  Health Maintenance Due  Topic Date Due  . COLONOSCOPY (Pts 45-84yrs Insurance coverage will need to be confirmed)  01/07/2020    Colorectal cancer screening: Type of screening: Colonoscopy. Completed 01/07/15. Repeat every 5 years. Pt scheduled to see [facility] on 08/03/20 for follow up.   Lung Cancer Screening: (Low Dose CT Chest recommended if Age 21-80 years, 30 pack-year currently smoking OR have quit w/in 15years.) does not qualify.   Additional Screening:  Hepatitis C Screening: does qualify; Completed 12/11/18  Vision Screening: Recommended annual ophthalmology exams for early detection of glaucoma and other disorders of the eye. Is the patient up to date with their annual eye exam?  No  Who is the provider or what is the name of the office in which the patient attends annual eye exams? [facility]  Dental Screening: Recommended annual dental exams for proper oral hygiene  Community Resource Referral / Chronic Care Management: CRR required this visit?  No   CCM required this visit?  No      Plan:     I have personally reviewed and noted the following in the patient's chart:   . Medical and social history . Use of alcohol, tobacco or illicit drugs  . Current medications and supplements including opioid prescriptions. Patient is not currently taking opioid prescriptions. . Functional ability and status . Nutritional status . Physical activity . Advanced directives . List of other  physicians . Hospitalizations, surgeries, and ER visits in previous  12 months . Vitals . Screenings to include cognitive, depression, and falls . Referrals and appointments  In addition, I have reviewed and discussed with patient certain preventive protocols, quality metrics, and best practice recommendations. A written personalized care plan for preventive services as well as general preventive health recommendations were provided to patient.     Reather LittlerKasey Dugan Vanhoesen, LPN   [id number]/78465/23/2022   Nurse Notes: pt doing well and appreciative of visit today.

## 2020-08-01 NOTE — Patient Instructions (Signed)
Dustin Arnold , Thank you for taking time to come for your Medicare Wellness Visit. I appreciate your ongoing commitment to your health goals. Please review the following plan we discussed and let me know if I can assist you in the future.   Screening recommendations/referrals: Colonoscopy: done 01/07/15. Due for repeat screening colonoscopy. Scheduled to follow up with [facility].  Recommended yearly ophthalmology/optometry visit for glaucoma screening and checkup Recommended yearly dental visit for hygiene and checkup  Vaccinations: Influenza vaccine: done 12/17/19 Pneumococcal vaccine: done 12/17/19 Tdap vaccine: done 11/27/17 Shingles vaccine: will contact [facility] for records   Covid-19: done 06/09/19, 06/23/19 & 12/25/19  Advanced directives: Please bring a copy of your health care power of attorney and living will to the office at your convenience.  Conditions/risks identified: Keep up the great work!  Next appointment: Follow up in one year for your annual wellness visit.   Preventive Care 4 Years and Older, Male Preventive care refers to lifestyle choices and visits with your health care provider that can promote health and wellness. What does preventive care include?  A yearly physical exam. This is also called an annual well check.  Dental exams once or twice a year.  Routine eye exams. Ask your health care provider how often you should have your eyes checked.  Personal lifestyle choices, including:  Daily care of your teeth and gums.  Regular physical activity.  Eating a healthy diet.  Avoiding tobacco and drug use.  Limiting alcohol use.  Practicing safe sex.  Taking low doses of aspirin every day.  Taking vitamin and mineral supplements as recommended by your health care provider. What happens during an annual well check? The services and screenings done by your health care provider during your annual well check will depend on your age, overall health,  lifestyle risk factors, and family history of disease. Counseling  Your health care provider may ask you questions about your:  Alcohol use.  Tobacco use.  Drug use.  Emotional well-being.  Home and relationship well-being.  Sexual activity.  Eating habits.  History of falls.  Memory and ability to understand (cognition).  Work and work Astronomer. Screening  You may have the following tests or measurements:  Height, weight, and BMI.  Blood pressure.  Lipid and cholesterol levels. These may be checked every 5 years, or more frequently if you are over 11 years old.  Skin check.  Lung cancer screening. You may have this screening every year starting at age 22 if you have a 30-pack-year history of smoking and currently smoke or have quit within the past 15 years.  Fecal occult blood test (FOBT) of the stool. You may have this test every year starting at age 91.  Flexible sigmoidoscopy or colonoscopy. You may have a sigmoidoscopy every 5 years or a colonoscopy every 10 years starting at age 20.  Prostate cancer screening. Recommendations will vary depending on your family history and other risks.  Hepatitis C blood test.  Hepatitis B blood test.  Sexually transmitted disease (STD) testing.  Diabetes screening. This is done by checking your blood sugar (glucose) after you have not eaten for a while (fasting). You may have this done every 1-3 years.  Abdominal aortic aneurysm (AAA) screening. You may need this if you are a current or former smoker.  Osteoporosis. You may be screened starting at age 90 if you are at high risk. Talk with your health care provider about your test results, treatment options, and if necessary,  the need for more tests. Vaccines  Your health care provider may recommend certain vaccines, such as:  Influenza vaccine. This is recommended every year.  Tetanus, diphtheria, and acellular pertussis (Tdap, Td) vaccine. You may need a Td booster  every 10 years.  Zoster vaccine. You may need this after age 46.  Pneumococcal 13-valent conjugate (PCV13) vaccine. One dose is recommended after age 33.  Pneumococcal polysaccharide (PPSV23) vaccine. One dose is recommended after age 23. Talk to your health care provider about which screenings and vaccines you need and how often you need them. This information is not intended to replace advice given to you by your health care provider. Make sure you discuss any questions you have with your health care provider. Document Released: 03/25/2015 Document Revised: 11/16/2015 Document Reviewed: 12/28/2014 Elsevier Interactive Patient Education  2017 [place] Prevention in the Home Falls can cause injuries. They can happen to people of all ages. There are many things you can do to make your home safe and to help prevent falls. What can I do on the outside of my home?  Regularly fix the edges of walkways and driveways and fix any cracks.  Remove anything that might make you trip as you walk through a door, such as a raised step or threshold.  Trim any bushes or trees on the path to your home.  Use bright outdoor lighting.  Clear any walking paths of anything that might make someone trip, such as rocks or tools.  Regularly check to see if handrails are loose or broken. Make sure that both sides of any steps have handrails.  Any raised decks and porches should have guardrails on the edges.  Have any leaves, snow, or ice cleared regularly.  Use sand or salt on walking paths during winter.  Clean up any spills in your garage right away. This includes oil or grease spills. What can I do in the bathroom?  Use night lights.  Install grab bars by the toilet and in the tub and shower. Do not use towel bars as grab bars.  Use non-skid mats or decals in the tub or shower.  If you need to sit down in the shower, use a plastic, non-slip stool.  Keep the floor dry. Clean up any  water that spills on the floor as soon as it happens.  Remove soap buildup in the tub or shower regularly.  Attach bath mats securely with double-sided non-slip rug tape.  Do not have throw rugs and other things on the floor that can make you trip. What can I do in the bedroom?  Use night lights.  Make sure that you have a light by your bed that is easy to reach.  Do not use any sheets or blankets that are too big for your bed. They should not hang down onto the floor.  Have a firm chair that has side arms. You can use this for support while you get dressed.  Do not have throw rugs and other things on the floor that can make you trip. What can I do in the kitchen?  Clean up any spills right away.  Avoid walking on wet floors.  Keep items that you use a lot in easy-to-reach places.  If you need to reach something above you, use a strong step stool that has a grab bar.  Keep electrical cords out of the way.  Do not use floor polish or wax that makes floors slippery. If you must use  wax, use non-skid floor wax.  Do not have throw rugs and other things on the floor that can make you trip. What can I do with my stairs?  Do not leave any items on the stairs.  Make sure that there are handrails on both sides of the stairs and use them. Fix handrails that are broken or loose. Make sure that handrails are as long as the stairways.  Check any carpeting to make sure that it is firmly attached to the stairs. Fix any carpet that is loose or worn.  Avoid having throw rugs at the top or bottom of the stairs. If you do have throw rugs, attach them to the floor with carpet tape.  Make sure that you have a light switch at the top of the stairs and the bottom of the stairs. If you do not have them, ask someone to add them for you. What else can I do to help prevent falls?  Wear shoes that:  Do not have high heels.  Have rubber bottoms.  Are comfortable and fit you well.  Are closed  at the toe. Do not wear sandals.  If you use a stepladder:  Make sure that it is fully opened. Do not climb a closed stepladder.  Make sure that both sides of the stepladder are locked into place.  Ask someone to hold it for you, if possible.  Clearly mark and make sure that you can see:  Any grab bars or handrails.  First and last steps.  Where the edge of each step is.  Use tools that help you move around (mobility aids) if they are needed. These include:  Canes.  Walkers.  Scooters.  Crutches.  Turn on the lights when you go into a dark area. Replace any light bulbs as soon as they burn out.  Set up your furniture so you have a clear path. Avoid moving your furniture around.  If any of your floors are uneven, fix them.  If there are any pets around you, be aware of where they are.  Review your medicines with your doctor. Some medicines can make you feel dizzy. This can increase your chance of falling. Ask your doctor what other things that you can do to help prevent falls. This information is not intended to replace advice given to you by your health care provider. Make sure you discuss any questions you have with your health care provider. Document Released: 12/23/2008 Document Revised: 08/04/2015 Document Reviewed: 04/02/2014 Elsevier Interactive Patient Education  2017 [employer].

## 2020-08-03 DIAGNOSIS — Z8371 Family history of colonic polyps: Secondary | ICD-10-CM | POA: Diagnosis not present

## 2020-08-03 DIAGNOSIS — Z951 Presence of aortocoronary bypass graft: Secondary | ICD-10-CM | POA: Diagnosis not present

## 2020-08-03 DIAGNOSIS — K5730 Diverticulosis of large intestine without perforation or abscess without bleeding: Secondary | ICD-10-CM | POA: Diagnosis not present

## 2020-10-14 DIAGNOSIS — K640 First degree hemorrhoids: Secondary | ICD-10-CM | POA: Diagnosis not present

## 2020-10-14 DIAGNOSIS — Z1211 Encounter for screening for malignant neoplasm of colon: Secondary | ICD-10-CM | POA: Diagnosis not present

## 2020-10-14 DIAGNOSIS — Z8371 Family history of colonic polyps: Secondary | ICD-10-CM | POA: Diagnosis not present

## 2020-10-14 DIAGNOSIS — K5520 Angiodysplasia of colon without hemorrhage: Secondary | ICD-10-CM | POA: Diagnosis not present

## 2020-10-14 LAB — HM COLONOSCOPY

## 2020-11-02 DIAGNOSIS — E7800 Pure hypercholesterolemia, unspecified: Secondary | ICD-10-CM | POA: Diagnosis not present

## 2020-11-02 DIAGNOSIS — Z951 Presence of aortocoronary bypass graft: Secondary | ICD-10-CM | POA: Diagnosis not present

## 2020-11-02 DIAGNOSIS — I25810 Atherosclerosis of coronary artery bypass graft(s) without angina pectoris: Secondary | ICD-10-CM | POA: Diagnosis not present

## 2020-11-02 DIAGNOSIS — I10 Essential (primary) hypertension: Secondary | ICD-10-CM | POA: Diagnosis not present

## 2020-12-30 ENCOUNTER — Ambulatory Visit (INDEPENDENT_AMBULATORY_CARE_PROVIDER_SITE_OTHER): Payer: Medicare Other | Admitting: Family Medicine

## 2020-12-30 ENCOUNTER — Other Ambulatory Visit: Payer: Self-pay

## 2020-12-30 ENCOUNTER — Encounter: Payer: Self-pay | Admitting: Family Medicine

## 2020-12-30 VITALS — BP 120/74 | HR 80 | Ht 72.0 in | Wt 235.0 lb

## 2020-12-30 DIAGNOSIS — Z23 Encounter for immunization: Secondary | ICD-10-CM

## 2020-12-30 DIAGNOSIS — Z0000 Encounter for general adult medical examination without abnormal findings: Secondary | ICD-10-CM

## 2020-12-30 DIAGNOSIS — R351 Nocturia: Secondary | ICD-10-CM | POA: Diagnosis not present

## 2020-12-30 NOTE — Progress Notes (Signed)
Date:  12/30/2020   Name:  Dustin Arnold   DOB:  1955/04/21   MRN:  [id number]   Chief Complaint: Annual Exam and Flu Vaccine  Dustin Arnold is a 65 y.o. male who presents today for his Complete Annual Exam. He feels well. He reports exercising daily. He reports he is sleeping well.     Lab Results  Component Value Date   CREATININE 1.18 12/17/2019   BUN 14 12/17/2019   NA 137 12/17/2019   K 4.6 12/17/2019   CL 100 12/17/2019   CO2 23 12/17/2019   Lab Results  Component Value Date   CHOL 148 12/17/2019   HDL 57 12/17/2019   LDLCALC 75 12/17/2019   TRIG 82 12/17/2019   CHOLHDL 2.2 12/02/2015   No results found for: TSH No results found for: HGBA1C Lab Results  Component Value Date   WBC 6.4 12/17/2019   HGB 13.9 12/17/2019   HCT 41.5 12/17/2019   MCV 94 12/17/2019   PLT 189 12/17/2019   Lab Results  Component Value Date   ALT 44 12/17/2019   AST 36 12/17/2019   ALKPHOS 61 12/17/2019   BILITOT 0.7 12/17/2019     Review of Systems  Constitutional:  Negative for chills and fever.  HENT:  Negative for drooling, ear discharge, ear pain and sore throat.   Eyes:  Negative for visual disturbance.  Respiratory:  Negative for cough, shortness of breath and wheezing.   Cardiovascular:  Negative for chest pain, palpitations and leg swelling.  Gastrointestinal:  Negative for abdominal pain, blood in stool, constipation, diarrhea and nausea.  Endocrine: Negative for polydipsia.  Genitourinary:  Negative for dysuria, frequency, hematuria and urgency.  Musculoskeletal:  Negative for back pain, myalgias and neck pain.  Skin:  Negative for rash.  Allergic/Immunologic: Negative for environmental allergies.  Neurological:  Negative for dizziness and headaches.  Hematological:  Does not bruise/bleed easily.  Psychiatric/Behavioral:  Negative for suicidal ideas. The patient is not nervous/anxious.    Patient Active Problem List   Diagnosis Date Noted   Colon polyp  01/19/2019   Prostate cancer screening 11/30/2014   H/O coronary artery bypass surgery 11/06/2013   Presence of aortocoronary bypass graft 11/06/2013   Arteriosclerosis of autologous vein coronary artery bypass graft 10/14/2013   Benign essential HTN 10/14/2013   HLD (hyperlipidemia) 10/14/2013    Allergies  Allergen Reactions   Other Other (See Comments)    IVP dye    Past Surgical History:  Procedure Laterality Date   CARDIAC SURGERY     bypass   CHOLECYSTECTOMY     COLONOSCOPY  2006   cleared for [facility]   COLONOSCOPY WITH PROPOFOL N/A 01/07/2015   Procedure: COLONOSCOPY WITH PROPOFOL;  Surgeon: Wallace Cullens, MD;  Location: [facility] ENDOSCOPY;  Service: Gastroenterology;  Laterality: N/A;   CORONARY ARTERY BYPASS GRAFT      Social History   Tobacco Use   Smoking status: Never   Smokeless tobacco: Never  Vaping Use   Vaping Use: Never used  Substance Use Topics   Alcohol use: Yes    Alcohol/week: 0.0 standard drinks   Drug use: No     Medication list has been reviewed and updated.  Current Meds  Medication Sig   aspirin 81 MG EC tablet Take 1 tablet by mouth daily.    atorvastatin (LIPITOR) 80 MG tablet Take 1 tablet by mouth daily.   loratadine (CLARITIN) 10 MG tablet Take 10 mg by mouth daily.  otc   metoprolol succinate (TOPROL-XL) 50 MG 24 hr tablet Take 1 tablet by mouth daily.    PHQ 2/9 Scores 12/30/2020 08/01/2020 12/17/2019 09/02/2019  PHQ - 2 Score 0 0 0 0  PHQ- 9 Score 0 - 0 1    GAD 7 : Generalized Anxiety Score 12/30/2020 12/17/2019 09/02/2019 07/01/2019  Nervous, Anxious, on Edge 0 0 - 0  Control/stop worrying 0 0 0 0  Worry too much - different things 0 0 0 0  Trouble relaxing 0 0 0 0  Restless 0 0 0 0  Easily annoyed or irritable 0 0 0 0  Afraid - awful might happen 0 0 0 0  Total GAD 7 Score 0 0 - 0  Anxiety Difficulty - - Not difficult at all -    BP Readings from Last 3 Encounters:  12/30/20 120/74  08/01/20 122/62  06/16/20 130/80     Physical Exam Vitals and nursing note reviewed.  Constitutional:      Appearance: He is well-developed, well-groomed and normal weight.  HENT:     Head: Normocephalic.     Jaw: There is normal jaw occlusion.     Right Ear: Hearing, tympanic membrane, ear canal and external ear normal. There is no impacted cerumen.     Left Ear: Hearing, tympanic membrane, ear canal and external ear normal. There is no impacted cerumen.     Nose: Nose normal. No congestion or rhinorrhea.     Mouth/Throat:     Lips: Pink.     Mouth: Mucous membranes are moist. No oral lesions.     Dentition: Normal dentition.     Tongue: No lesions.     Palate: No lesions.  Eyes:     General: Lids are normal. Vision grossly intact. Gaze aligned appropriately. No scleral icterus.       Right eye: No discharge.        Left eye: No discharge.     Extraocular Movements: Extraocular movements intact.     Conjunctiva/sclera: Conjunctivae normal.     Pupils: Pupils are equal, round, and reactive to light.  Neck:     Thyroid: No thyroid mass, thyromegaly or thyroid tenderness.     Vascular: No JVD.     Trachea: No tracheal deviation.  Cardiovascular:     Rate and Rhythm: Normal rate and regular rhythm.     Chest Wall: PMI is not displaced.     Pulses: Normal pulses.     Heart sounds: Normal heart sounds, S1 normal and S2 normal. No murmur heard. No systolic murmur is present.  No diastolic murmur is present.    No friction rub. No gallop. No S3 or S4 sounds.  Pulmonary:     Effort: Pulmonary effort is normal. No respiratory distress.     Breath sounds: Normal breath sounds. No decreased air movement. No wheezing, rhonchi or rales.  Chest:     Chest wall: No mass or tenderness.  Breasts:    Breasts are symmetrical.     Right: Normal.     Left: Normal.  Abdominal:     General: Bowel sounds are normal. There is no abdominal bruit.     Palpations: Abdomen is soft. There is no hepatomegaly, splenomegaly, mass or  pulsatile mass.     Tenderness: There is no abdominal tenderness. There is no guarding or rebound.     Hernia: There is no hernia in the umbilical area, ventral area, left inguinal area or right inguinal area.  Genitourinary:  Penis: Normal and circumcised.      Testes: Normal.     Epididymis:     Right: Normal.     Left: Normal.     Prostate: Normal. Not enlarged, not tender and no nodules present.     Rectum: Normal. Guaiac result negative. No mass.  Musculoskeletal:        General: No tenderness. Normal range of motion.     Cervical back: Normal, full passive range of motion without pain, normal range of motion and neck supple.     Thoracic back: Normal.     Lumbar back: Normal.     Right lower leg: No edema.     Left lower leg: No edema.  Lymphadenopathy:     Head:     Right side of head: No submental, submandibular or tonsillar adenopathy.     Left side of head: No submental, submandibular or tonsillar adenopathy.     Cervical: No cervical adenopathy.     Right cervical: No superficial, deep or posterior cervical adenopathy.    Left cervical: No superficial, deep or posterior cervical adenopathy.     Upper Body:     Right upper body: No supraclavicular or axillary adenopathy.     Left upper body: No supraclavicular or axillary adenopathy.     Lower Body: No right inguinal adenopathy. No left inguinal adenopathy.  Skin:    General: Skin is warm.     Capillary Refill: Capillary refill takes less than 2 seconds.     Findings: No rash.  Neurological:     Mental Status: He is alert and oriented to person, place, and time.     Cranial Nerves: Cranial nerves 2-12 are intact. No cranial nerve deficit.     Sensory: Sensation is intact.     Motor: Motor function is intact.     Coordination: Romberg sign negative.     Deep Tendon Reflexes: Reflexes are normal and symmetric.     Reflex Scores:      Tricep reflexes are 2+ on the right side and 2+ on the left side.      Bicep  reflexes are 2+ on the right side and 2+ on the left side.      Brachioradialis reflexes are 2+ on the right side and 2+ on the left side.      Patellar reflexes are 2+ on the right side and 2+ on the left side.      Achilles reflexes are 2+ on the right side and 2+ on the left side. Psychiatric:        Behavior: Behavior is cooperative.    Wt Readings from Last 3 Encounters:  12/30/20 235 lb (106.6 kg)  08/01/20 229 lb 6.4 oz (104.1 kg)  06/16/20 229 lb (103.9 kg)    BP 120/74   Pulse 80   Ht 6' (1.829 m)   Wt 235 lb (106.6 kg)   BMI 31.87 kg/m   Assessment and Plan: VIET KEMMERER is a 65 y.o. male who presents today for his Complete Annual Exam. He feels well. He reports exercising walking. He reports he is sleeping well.  Patient's chart was reviewed for previous encounters most recent lab most recent imaging in Care Everywhere. 1. Annual physical exam Immunizations are reviewed and recommendations provided.   Age appropriate screening tests are discussed. Counseling given for risk factor reduction interventions.  No subjective/objective concerns noted during history and physical exam.  We will obtain labs consisting of lipid panel and comprehensive  metabolic panel. - Lipid Panel With LDL/HDL Ratio - Comprehensive Metabolic Panel (CMET)  2. Nocturia Patient has occasional nocturia.  DRE was normal with no enlargement or nodularity.  Consistency was normal of the prostate.  We will obtain PSA. - PSA  3. Need for immunization against influenza Discussed and administered. - Flu Vaccine QUAD High Dose(Fluad)

## 2020-12-31 LAB — COMPREHENSIVE METABOLIC PANEL
ALT: 40 IU/L (ref 0–44)
AST: 33 IU/L (ref 0–40)
Albumin/Globulin Ratio: 2.2 (ref 1.2–2.2)
Albumin: 4.3 g/dL (ref 3.8–4.8)
Alkaline Phosphatase: 63 IU/L (ref 44–121)
BUN/Creatinine Ratio: 14 (ref 10–24)
BUN: 15 mg/dL (ref 8–27)
Bilirubin Total: 0.8 mg/dL (ref 0.0–1.2)
CO2: 24 mmol/L (ref 20–29)
Calcium: 9.2 mg/dL (ref 8.6–10.2)
Chloride: 103 mmol/L (ref 96–106)
Creatinine, Ser: 1.06 mg/dL (ref 0.76–1.27)
Globulin, Total: 2 g/dL (ref 1.5–4.5)
Glucose: 86 mg/dL (ref 70–99)
Potassium: 4.6 mmol/L (ref 3.5–5.2)
Sodium: 140 mmol/L (ref 134–144)
Total Protein: 6.3 g/dL (ref 6.0–8.5)
eGFR: 77 mL/min/{1.73_m2} (ref >59)

## 2020-12-31 LAB — LIPID PANEL WITH LDL/HDL RATIO
Cholesterol, Total: 148 mg/dL (ref 100–199)
HDL: 55 mg/dL (ref >39)
LDL Chol Calc (NIH): 78 mg/dL (ref 0–99)
LDL/HDL Ratio: 1.4 ratio (ref 0.0–3.6)
Triglycerides: 75 mg/dL (ref 0–149)
VLDL Cholesterol Cal: 15 mg/dL (ref 5–40)

## 2020-12-31 LAB — PSA: Prostate Specific Ag, Serum: 0.5 ng/mL (ref 0.0–4.0)

## 2021-03-02 ENCOUNTER — Ambulatory Visit (INDEPENDENT_AMBULATORY_CARE_PROVIDER_SITE_OTHER): Payer: Medicare Other | Admitting: Family Medicine

## 2021-03-02 ENCOUNTER — Other Ambulatory Visit: Payer: Self-pay

## 2021-03-02 ENCOUNTER — Encounter: Payer: Self-pay | Admitting: Family Medicine

## 2021-03-02 VITALS — BP 110/80 | HR 100 | Temp 98.6°F | Ht 72.0 in | Wt 240.0 lb

## 2021-03-02 DIAGNOSIS — R000 Tachycardia, unspecified: Secondary | ICD-10-CM

## 2021-03-02 DIAGNOSIS — R051 Acute cough: Secondary | ICD-10-CM | POA: Diagnosis not present

## 2021-03-02 DIAGNOSIS — E860 Dehydration: Secondary | ICD-10-CM

## 2021-03-02 DIAGNOSIS — J219 Acute bronchiolitis, unspecified: Secondary | ICD-10-CM | POA: Diagnosis not present

## 2021-03-02 LAB — POCT INFLUENZA A/B
Influenza A, POC: NEGATIVE
Influenza B, POC: NEGATIVE

## 2021-03-02 MED ORDER — AZITHROMYCIN 250 MG PO TABS: ORAL_TABLET | ORAL | 0 refills | Status: AC

## 2021-03-02 MED ORDER — GUAIFENESIN-CODEINE 100-10 MG/5ML PO SYRP
5.0000 mL | ORAL_SOLUTION | Freq: Three times a day (TID) | ORAL | 0 refills | Status: DC | PRN
Start: 2021-03-02 — End: 2021-08-16

## 2021-03-02 MED ORDER — BENZONATATE 100 MG PO CAPS: 100.0000 mg | ORAL_CAPSULE | Freq: Three times a day (TID) | ORAL | 0 refills | Status: DC

## 2021-03-02 NOTE — Progress Notes (Addendum)
Date:  03/02/2021   Name:  Dustin Arnold   DOB:  Sep 30, 1955   MRN:  [id number]   Chief Complaint: Cough (Rattling in chest, coughing- little production. Taking Mucinex OTC and Nyquil. No fever, no body aches. )  Cough This is a new problem. The current episode started in the past 7 days (Monday). The problem has been unchanged. The problem occurs every few hours. The cough is Productive of purulent sputum. Associated symptoms include nasal congestion, a sore throat and wheezing. Pertinent negatives include no chest pain, chills, ear congestion, ear pain, fever, headaches, heartburn, hemoptysis, myalgias, postnasal drip, rash, rhinorrhea, shortness of breath, sweats or weight loss. Nothing aggravates the symptoms. There is no history of environmental allergies.   Lab Results  Component Value Date   NA 140 12/30/2020   K 4.6 12/30/2020   CO2 24 12/30/2020   GLUCOSE 86 12/30/2020   BUN 15 12/30/2020   CREATININE 1.06 12/30/2020   CALCIUM 9.2 12/30/2020   EGFR 77 12/30/2020   GFRNONAA 64 12/17/2019   Lab Results  Component Value Date   CHOL 148 12/30/2020   HDL 55 12/30/2020   LDLCALC 78 12/30/2020   TRIG 75 12/30/2020   CHOLHDL 2.2 12/02/2015   No results found for: TSH No results found for: HGBA1C Lab Results  Component Value Date   WBC 6.4 12/17/2019   HGB 13.9 12/17/2019   HCT 41.5 12/17/2019   MCV 94 12/17/2019   PLT 189 12/17/2019   Lab Results  Component Value Date   ALT 40 12/30/2020   AST 33 12/30/2020   ALKPHOS 63 12/30/2020   BILITOT 0.8 12/30/2020   No results found for: 25OHVITD2, 25OHVITD3, VD25OH   Review of Systems  Constitutional:  Negative for chills, fever and weight loss.  HENT:  Positive for sore throat. Negative for drooling, ear discharge, ear pain, postnasal drip and rhinorrhea.   Respiratory:  Positive for cough and wheezing. Negative for hemoptysis and shortness of breath.   Cardiovascular:  Negative for chest pain, palpitations and leg  swelling.  Gastrointestinal:  Negative for abdominal pain, blood in stool, constipation, diarrhea, heartburn and nausea.  Endocrine: Negative for polydipsia.  Genitourinary:  Negative for dysuria, frequency, hematuria and urgency.  Musculoskeletal:  Negative for back pain, myalgias and neck pain.  Skin:  Negative for rash.  Allergic/Immunologic: Negative for environmental allergies.  Neurological:  Negative for dizziness and headaches.  Hematological:  Does not bruise/bleed easily.  Psychiatric/Behavioral:  Negative for suicidal ideas. The patient is not nervous/anxious.    Patient Active Problem List   Diagnosis Date Noted   Colon polyp 01/19/2019   Prostate cancer screening 11/30/2014   H/O coronary artery bypass surgery 11/06/2013   Presence of aortocoronary bypass graft 11/06/2013   Arteriosclerosis of autologous vein coronary artery bypass graft 10/14/2013   Benign essential HTN 10/14/2013   HLD (hyperlipidemia) 10/14/2013    Allergies  Allergen Reactions   Other Other (See Comments)    IVP dye    Past Surgical History:  Procedure Laterality Date   CARDIAC SURGERY     bypass   CHOLECYSTECTOMY     COLONOSCOPY  2006   cleared for [facility]   COLONOSCOPY WITH PROPOFOL N/A 01/07/2015   Procedure: COLONOSCOPY WITH PROPOFOL;  Surgeon: Hulen Luster, MD;  Location: [facility] ENDOSCOPY;  Service: Gastroenterology;  Laterality: N/A;   CORONARY ARTERY BYPASS GRAFT      Social History   Tobacco Use   Smoking status: Never  Smokeless tobacco: Never  Vaping Use   Vaping Use: Never used  Substance Use Topics   Alcohol use: Yes    Alcohol/week: 0.0 standard drinks   Drug use: No     Medication list has been reviewed and updated.  Current Meds  Medication Sig   aspirin 81 MG EC tablet Take 1 tablet by mouth daily.    atorvastatin (LIPITOR) 80 MG tablet Take 1 tablet by mouth daily.   loratadine (CLARITIN) 10 MG tablet Take 10 mg by mouth daily. otc   metoprolol succinate  (TOPROL-XL) 50 MG 24 hr tablet Take 1 tablet by mouth daily.   Multiple Vitamin (MULTIVITAMIN) capsule Take 1 capsule by mouth daily.    PHQ 2/9 Scores 12/30/2020 08/01/2020 12/17/2019 09/02/2019  PHQ - 2 Score 0 0 0 0  PHQ- 9 Score 0 - 0 1    GAD 7 : Generalized Anxiety Score 12/30/2020 12/17/2019 09/02/2019 07/01/2019  Nervous, Anxious, on Edge 0 0 - 0  Control/stop worrying 0 0 0 0  Worry too much - different things 0 0 0 0  Trouble relaxing 0 0 0 0  Restless 0 0 0 0  Easily annoyed or irritable 0 0 0 0  Afraid - awful might happen 0 0 0 0  Total GAD 7 Score 0 0 - 0  Anxiety Difficulty - - Not difficult at all -    BP Readings from Last 3 Encounters:  03/02/21 110/80  12/30/20 120/74  08/01/20 122/62    Physical Exam Vitals and nursing note reviewed.  HENT:     Right Ear: Tympanic membrane normal.     Left Ear: Tympanic membrane normal.     Mouth/Throat:     Lips: Pink.     Mouth: Mucous membranes are dry.     Dentition: Normal dentition.     Tongue: No lesions.  Cardiovascular:     Heart sounds: No murmur heard.   No friction rub. No gallop.  Pulmonary:     Effort: No respiratory distress.     Breath sounds: No stridor or transmitted upper airway sounds. No decreased breath sounds, wheezing, rhonchi or rales.  Musculoskeletal:     Cervical back: No rigidity.  Lymphadenopathy:     Cervical: No cervical adenopathy.    Wt Readings from Last 3 Encounters:  03/02/21 240 lb (108.9 kg)  12/30/20 235 lb (106.6 kg)  08/01/20 229 lb 6.4 oz (104.1 kg)    BP 110/80    Pulse 100    Temp 98.6 F (37 C) (Oral)    Ht 6' (1.829 m)    Wt 240 lb (108.9 kg)    SpO2 97%    BMI 32.55 kg/m   Assessment and Plan:  1. Bronchiolitis New onset.  Persistent.  Patient has had a cough with mild production for the past week.  There is been no fever no shortness of breath however patient is noted to be tachycardic.  Examination of cardiopulmonary is unremarkable.  There is some mild  fatigue creasing the expiratory to inspiratory ratio which will necessitate azithromycin to 50 mg 2 today followed by 1 a day for 4 days. - azithromycin (ZITHROMAX) 250 MG tablet; Take 2 tablets on day 1, then 1 tablet daily on days 2 through 5  Dispense: 6 tablet; Refill: 0  2. Tachycardia Patient was noted to be tachycardic over 100 and this is elevated for him since he is usually in the 60-72 range.  EKG was obtained with the following  results: Atrial tachycardia with a rate of 106 intervals are noted to be normal P waves are noted and that there is no flutter/fibrillation.  No ischemic changes are noted such as Q waves, ST-T wave changes, nor delay in R wave progression.  Exam is consistent with decreased mucous membranes and there may be some volume under load that needs to be addressed with fluid intake and this has been discussed. - EKG 12-Lead  3. Acute cough Persistent cough that is causing issues with sleep at night.  We will use Tessalon Perles for control during the day with a teaspoon to take mostly at night in order persistent facilitation of sleep as well.  COVID was noted to be negative at home and influenza was negative at the office. - POCT Influenza A/B - benzonatate (TESSALON PERLES) 100 MG capsule; Take 1 capsule (100 mg total) by mouth 3 (three) times daily.  Dispense: 20 capsule; Refill: 0 - guaiFENesin-codeine (ROBITUSSIN AC) 100-10 MG/5ML syrup; Take 5 mLs by mouth 3 (three) times daily as needed for cough.  Dispense: 118 mL; Refill: 0  4. Dehydration New onset.  Persistent.  Patient is dehydrated and intakes: Need to be increased up a fluid nature and this has been fully addressed and the patient will proceed in this direction.  Patient has been instructed that if any of these circumstance should worsen he is to go to the emergency room for further eval.

## 2021-03-29 DIAGNOSIS — I10 Essential (primary) hypertension: Secondary | ICD-10-CM | POA: Diagnosis not present

## 2021-03-29 DIAGNOSIS — H2513 Age-related nuclear cataract, bilateral: Secondary | ICD-10-CM | POA: Diagnosis not present

## 2021-05-13 DIAGNOSIS — H524 Presbyopia: Secondary | ICD-10-CM | POA: Diagnosis not present

## 2021-05-24 DIAGNOSIS — Z951 Presence of aortocoronary bypass graft: Secondary | ICD-10-CM | POA: Diagnosis not present

## 2021-05-24 DIAGNOSIS — I10 Essential (primary) hypertension: Secondary | ICD-10-CM | POA: Diagnosis not present

## 2021-05-24 DIAGNOSIS — E7800 Pure hypercholesterolemia, unspecified: Secondary | ICD-10-CM | POA: Diagnosis not present

## 2021-05-24 DIAGNOSIS — I25810 Atherosclerosis of coronary artery bypass graft(s) without angina pectoris: Secondary | ICD-10-CM | POA: Diagnosis not present

## 2021-08-02 ENCOUNTER — Ambulatory Visit: Payer: Medicare Other

## 2021-08-14 ENCOUNTER — Ambulatory Visit: Payer: Medicare Other

## 2021-08-15 ENCOUNTER — Other Ambulatory Visit: Payer: Self-pay

## 2021-08-16 ENCOUNTER — Ambulatory Visit (INDEPENDENT_AMBULATORY_CARE_PROVIDER_SITE_OTHER): Payer: Medicare Other

## 2021-08-16 DIAGNOSIS — Z0000 Encounter for general adult medical examination without abnormal findings: Secondary | ICD-10-CM | POA: Diagnosis not present

## 2021-08-16 NOTE — Patient Instructions (Signed)
Dustin Arnold , Thank you for taking time to come for your Medicare Wellness Visit. I appreciate your ongoing commitment to your health goals. Please review the following plan we discussed and let me know if I can assist you in the future.   Screening recommendations/referrals: Colonoscopy: done 10/14/20. Repeat 10/2025 Recommended yearly ophthalmology/optometry visit for glaucoma screening and checkup Recommended yearly dental visit for hygiene and checkup  Vaccinations: Influenza vaccine: done 12/30/20 Pneumococcal vaccine: done 12/17/19 Tdap vaccine: done 11/27/17 Shingles vaccine: please bring a copy of your vaccine record to your next appointment   Covid-19: done 06/09/19, 06/23/19 & 12/25/19  Advanced directives: Please bring a copy of your health care power of attorney and living will to the office at your convenience.   Conditions/risks identified: Keep up the great work!  Next appointment: Follow up in one year for your annual wellness visit.   Preventive Care 37 Years and Older, Male Preventive care refers to lifestyle choices and visits with your health care provider that can promote health and wellness. What does preventive care include? A yearly physical exam. This is also called an annual well check. Dental exams once or twice a year. Routine eye exams. Ask your health care provider how often you should have your eyes checked. Personal lifestyle choices, including: Daily care of your teeth and gums. Regular physical activity. Eating a healthy diet. Avoiding tobacco and drug use. Limiting alcohol use. Practicing safe sex. Taking low doses of aspirin every day. Taking vitamin and mineral supplements as recommended by your health care provider. What happens during an annual well check? The services and screenings done by your health care provider during your annual well check will depend on your age, overall health, lifestyle risk factors, and family history of  disease. Counseling  Your health care provider may ask you questions about your: Alcohol use. Tobacco use. Drug use. Emotional well-being. Home and relationship well-being. Sexual activity. Eating habits. History of falls. Memory and ability to understand (cognition). Work and work Astronomer. Screening  You may have the following tests or measurements: Height, weight, and BMI. Blood pressure. Lipid and cholesterol levels. These may be checked every 5 years, or more frequently if you are over 17 years old. Skin check. Lung cancer screening. You may have this screening every year starting at age 103 if you have a 30-pack-year history of smoking and currently smoke or have quit within the past 15 years. Fecal occult blood test (FOBT) of the stool. You may have this test every year starting at age 53. Flexible sigmoidoscopy or colonoscopy. You may have a sigmoidoscopy every 5 years or a colonoscopy every 10 years starting at age 38. Prostate cancer screening. Recommendations will vary depending on your family history and other risks. Hepatitis C blood test. Hepatitis B blood test. Sexually transmitted disease (STD) testing. Diabetes screening. This is done by checking your blood sugar (glucose) after you have not eaten for a while (fasting). You may have this done every 1-3 years. Abdominal aortic aneurysm (AAA) screening. You may need this if you are a current or former smoker. Osteoporosis. You may be screened starting at age 65 if you are at high risk. Talk with your health care provider about your test results, treatment options, and if necessary, the need for more tests. Vaccines  Your health care provider may recommend certain vaccines, such as: Influenza vaccine. This is recommended every year. Tetanus, diphtheria, and acellular pertussis (Tdap, Td) vaccine. You may need a Td booster every 10  years. Zoster vaccine. You may need this after age 4. Pneumococcal 13-valent  conjugate (PCV13) vaccine. One dose is recommended after age 64. Pneumococcal polysaccharide (PPSV23) vaccine. One dose is recommended after age 80. Talk to your health care provider about which screenings and vaccines you need and how often you need them. This information is not intended to replace advice given to you by your health care provider. Make sure you discuss any questions you have with your health care provider. Document Released: 03/25/2015 Document Revised: 11/16/2015 Document Reviewed: 12/28/2014 Elsevier Interactive Patient Education  2017 [place] Prevention in the Home Falls can cause injuries. They can happen to people of all ages. There are many things you can do to make your home safe and to help prevent falls. What can I do on the outside of my home? Regularly fix the edges of walkways and driveways and fix any cracks. Remove anything that might make you trip as you walk through a door, such as a raised step or threshold. Trim any bushes or trees on the path to your home. Use bright outdoor lighting. Clear any walking paths of anything that might make someone trip, such as rocks or tools. Regularly check to see if handrails are loose or broken. Make sure that both sides of any steps have handrails. Any raised decks and porches should have guardrails on the edges. Have any leaves, snow, or ice cleared regularly. Use sand or salt on walking paths during winter. Clean up any spills in your garage right away. This includes oil or grease spills. What can I do in the bathroom? Use night lights. Install grab bars by the toilet and in the tub and shower. Do not use towel bars as grab bars. Use non-skid mats or decals in the tub or shower. If you need to sit down in the shower, use a plastic, non-slip stool. Keep the floor dry. Clean up any water that spills on the floor as soon as it happens. Remove soap buildup in the tub or shower regularly. Attach bath mats  securely with double-sided non-slip rug tape. Do not have throw rugs and other things on the floor that can make you trip. What can I do in the bedroom? Use night lights. Make sure that you have a light by your bed that is easy to reach. Do not use any sheets or blankets that are too big for your bed. They should not hang down onto the floor. Have a firm chair that has side arms. You can use this for support while you get dressed. Do not have throw rugs and other things on the floor that can make you trip. What can I do in the kitchen? Clean up any spills right away. Avoid walking on wet floors. Keep items that you use a lot in easy-to-reach places. If you need to reach something above you, use a strong step stool that has a grab bar. Keep electrical cords out of the way. Do not use floor polish or wax that makes floors slippery. If you must use wax, use non-skid floor wax. Do not have throw rugs and other things on the floor that can make you trip. What can I do with my stairs? Do not leave any items on the stairs. Make sure that there are handrails on both sides of the stairs and use them. Fix handrails that are broken or loose. Make sure that handrails are as long as the stairways. Check any carpeting to make sure  that it is firmly attached to the stairs. Fix any carpet that is loose or worn. Avoid having throw rugs at the top or bottom of the stairs. If you do have throw rugs, attach them to the floor with carpet tape. Make sure that you have a light switch at the top of the stairs and the bottom of the stairs. If you do not have them, ask someone to add them for you. What else can I do to help prevent falls? Wear shoes that: Do not have high heels. Have rubber bottoms. Are comfortable and fit you well. Are closed at the toe. Do not wear sandals. If you use a stepladder: Make sure that it is fully opened. Do not climb a closed stepladder. Make sure that both sides of the stepladder  are locked into place. Ask someone to hold it for you, if possible. Clearly mark and make sure that you can see: Any grab bars or handrails. First and last steps. Where the edge of each step is. Use tools that help you move around (mobility aids) if they are needed. These include: Canes. Walkers. Scooters. Crutches. Turn on the lights when you go into a dark area. Replace any light bulbs as soon as they burn out. Set up your furniture so you have a clear path. Avoid moving your furniture around. If any of your floors are uneven, fix them. If there are any pets around you, be aware of where they are. Review your medicines with your doctor. Some medicines can make you feel dizzy. This can increase your chance of falling. Ask your doctor what other things that you can do to help prevent falls. This information is not intended to replace advice given to you by your health care provider. Make sure you discuss any questions you have with your health care provider. Document Released: 12/23/2008 Document Revised: 08/04/2015 Document Reviewed: 04/02/2014 Elsevier Interactive Patient Education  2017 [employer].

## 2021-08-16 NOTE — Progress Notes (Signed)
Subjective:   Dustin Arnold is a 66 y.o. male who presents for Medicare Annual/Subsequent preventive examination.  Virtual Visit via Telephone Note  I connected with  [name] on 08/16/21 at  8:15 AM EDT by telephone and verified that I am speaking with the correct person using two identifiers.  Location: Patient: home Provider: [place] Persons participating in the virtual visit: [place]   I discussed the limitations, risks, security and privacy concerns of performing an evaluation and management service by telephone and the availability of in person appointments. The patient expressed understanding and agreed to proceed.  Interactive audio and video telecommunications were attempted between this nurse and patient, however failed, due to patient having technical difficulties OR patient did not have access to video capability.  We continued and completed visit with audio only.  Some vital signs may be absent or patient reported.   Clemetine Marker, LPN   Review of Systems     Cardiac Risk Factors include: advanced age (>85men, >82 women);dyslipidemia;male gender;hypertension     Objective:    There were no vitals filed for this visit. There is no height or weight on file to calculate BMI.     08/16/2021    8:27 AM 08/01/2020    9:35 AM 05/18/2017    9:27 AM 01/07/2015    8:58 AM 11/30/2014    8:51 AM  Advanced Directives  Does Patient Have a Medical Advance Directive? Yes Yes Yes Yes Yes  Type of Paramedic of [place];Living will [place];Living will Living will  [place];Living will  Copy of [place] in Chart? No - copy requested No - copy requested   No - copy requested    Current Medications (verified) Outpatient Encounter Medications as of 08/16/2021  Medication Sig   aspirin 81 MG EC tablet Take 1 tablet by mouth daily.    atorvastatin (LIPITOR) 80 MG tablet Take 1  tablet by mouth daily.   loratadine (CLARITIN) 10 MG tablet Take 10 mg by mouth daily. otc   metoprolol succinate (TOPROL-XL) 50 MG 24 hr tablet Take 1 tablet by mouth daily.   Multiple Vitamin (MULTIVITAMIN) capsule Take 1 capsule by mouth daily.   [DISCONTINUED] benzonatate (TESSALON PERLES) 100 MG capsule Take 1 capsule (100 mg total) by mouth 3 (three) times daily.   [DISCONTINUED] guaiFENesin-codeine (ROBITUSSIN AC) 100-10 MG/5ML syrup Take 5 mLs by mouth 3 (three) times daily as needed for cough.   No facility-administered encounter medications on file as of 08/16/2021.    Allergies (verified) Other   History: Past Medical History:  Diagnosis Date   Coronary artery disease    Hyperlipemia    Hypertension    Past Surgical History:  Procedure Laterality Date   CARDIAC SURGERY     bypass   CHOLECYSTECTOMY     COLONOSCOPY  2006   cleared for [facility]   COLONOSCOPY WITH PROPOFOL N/A 01/07/2015   Procedure: COLONOSCOPY WITH PROPOFOL;  Surgeon: Hulen Luster, MD;  Location: [facility] ENDOSCOPY;  Service: Gastroenterology;  Laterality: N/A;   CORONARY ARTERY BYPASS GRAFT     Family History  Problem Relation Age of Onset   Cancer Father        esophageal   Social History   Socioeconomic History   Marital status: Married    Spouse name: Not on file   Number of children: 1   Years of education: Not on file   Highest education  level: Master's degree (e.g., MA, MS, MEng, MEd, MSW, MBA)  Occupational History   Occupation: retired  Tobacco Use   Smoking status: Never   Smokeless tobacco: Never  Vaping Use   Vaping Use: Never used  Substance and Sexual Activity   Alcohol use: Yes   Drug use: No   Sexual activity: Yes  Other Topics Concern   Not on file  Social History Narrative   Not on file   Social Determinants of Health   Financial Resource Strain: Low Risk    Difficulty of Paying Living Expenses: Not hard at all  Food Insecurity: No Food Insecurity   Worried About Paediatric nurse in the Last Year: Never true   [place] in the Last Year: Never true  Transportation Needs: No Transportation Needs   Lack of Transportation (Medical): No   Lack of Transportation (Non-Medical): No  Physical Activity: Sufficiently Active   Days of Exercise per Week: 3 days   Minutes of Exercise per Session: 120 min  Stress: No Stress Concern Present   Feeling of Stress : Not at all  Social Connections: Socially Integrated   Frequency of Communication with Friends and Family: More than three times a week   Frequency of Social Gatherings with Friends and Family: Three times a week   Attends Religious Services: More than 4 times per year   Active Member of Clubs or Organizations: Yes   Attends Music therapist: More than 4 times per year   Marital Status: Married    Tobacco Counseling Counseling given: Not Answered   Clinical Intake:  Pre-visit preparation completed: Yes  Pain : No/denies pain     Nutritional Risks: None Diabetes: No  How often do you need to have someone help you when you read instructions, pamphlets, or other written materials from your doctor or pharmacy?: 1 - Never    Interpreter Needed?: No  Information entered by :: Clemetine Marker LPN   Activities of Daily Living    08/16/2021    8:28 AM  In your present state of health, do you have any difficulty performing the following activities:  Hearing? 0  Vision? 0  Difficulty concentrating or making decisions? 0  Walking or climbing stairs? 0  Dressing or bathing? 0  Doing errands, shopping? 0  Preparing Food and eating ? N  Using the Toilet? N  In the past six months, have you accidently leaked urine? N  Do you have problems with loss of bowel control? N  Managing your Medications? N  Managing your Finances? N  Housekeeping or managing your Housekeeping? N    Patient Care Team: Juline Patch, MD as PCP - General (Family Medicine) Isaias Cowman, MD as  Consulting Physician (Cardiology)  Indicate any recent Medical Services you may have received from other than [facility] providers in the past year (date may be approximate).     Assessment:   This is a routine wellness examination for [facility].  Hearing/Vision screen Hearing Screening - Comments:: Pt denies hearing difficulty Vision Screening - Comments:: Annual vision screenings done at [facility] issues and exercise activities discussed: Current Exercise Habits: Home exercise routine, Type of exercise: Other - see comments (hiking, golf), Time (Minutes): > 60, Frequency (Times/Week): 3, Weekly Exercise (Minutes/Week): 0, Intensity: Moderate, Exercise limited by: None identified   Goals Addressed   None    Depression Screen    08/16/2021    8:26 AM 12/30/2020  10:01 AM 08/01/2020    9:32 AM 12/17/2019    8:40 AM 09/02/2019   10:46 AM 07/01/2019    8:28 AM 12/11/2018   11:14 AM  PHQ 2/9 Scores  PHQ - 2 Score 0 0 0 0 0 0 0  PHQ- 9 Score  0  0 1 0 0    Fall Risk    08/16/2021    8:28 AM 08/01/2020    9:36 AM 12/17/2019    8:40 AM 09/02/2019   10:45 AM 07/01/2019    8:28 AM  Fall Risk   Falls in the past year? 0 0 0 0 0  Number falls in past yr: 0 0     Injury with Fall? 0 0     Risk for fall due to : No Fall Risks No Fall Risks  No Fall Risks   Follow up Falls prevention discussed Falls prevention discussed Falls evaluation completed Falls evaluation completed Falls evaluation completed    FALL RISK PREVENTION PERTAINING TO THE HOME:  Any stairs in or around the home? Yes  If so, are there any without handrails? No  Home free of loose throw rugs in walkways, pet beds, electrical cords, etc? Yes  Adequate lighting in your home to reduce risk of falls? Yes   ASSISTIVE DEVICES UTILIZED TO PREVENT FALLS:  Life alert? No  Use of a cane, walker or w/c? No  Grab bars in the bathroom? No  Shower chair or bench in shower? No  Elevated toilet seat or a handicapped  toilet? No   TIMED UP AND GO:  Was the test performed? No . Telephonic visit.   Cognitive Function: Normal cognitive status assessed by direct observation by this Nurse Health Advisor. No abnormalities found.          Immunizations Immunization History  Administered Date(s) Administered   Fluad Quad(high Dose 65+) 12/17/2019, 12/30/2020   Influenza,inj,Quad PF,6+ Mos 11/30/2014, 11/27/2017, 12/11/2018   PFIZER(Purple Top)SARS-COV-2 Vaccination 06/09/2019, 06/23/2019, 12/25/2019   Pneumococcal Conjugate-13 12/17/2019   Pneumococcal Polysaccharide-23 11/30/2014   Tdap 11/27/2017   Vaxelis (DTaP,IPV,Hib,HepB) 01/27/2020    TDAP status: Up to date  Flu Vaccine status: Up to date  Pneumococcal vaccine status: Up to date  Covid-19 vaccine status: Completed vaccines  Qualifies for Shingles Vaccine? Yes   Zostavax completed No   Shingrix Completed?: No.    Education has been provided regarding the importance of this vaccine. Patient has been advised to call insurance company to determine out of pocket expense if they have not yet received this vaccine. Advised may also receive vaccine at local pharmacy or Health Dept. Verbalized acceptance and understanding.  Screening Tests Health Maintenance  Topic Date Due   Zoster Vaccines- Shingrix (1 of 2) Never done   COVID-19 Vaccine (4 - Booster) 02/19/2020   Pneumonia Vaccine 72+ Years old (3) 12/16/2020   INFLUENZA VACCINE  10/10/2021   COLONOSCOPY (Pts 45-38yrs Insurance coverage will need to be confirmed)  10/14/2025   TETANUS/TDAP  11/28/2027   Hepatitis C Screening  Completed   HPV VACCINES  Aged Out    Health Maintenance  Health Maintenance Due  Topic Date Due   Zoster Vaccines- Shingrix (1 of 2) Never done   COVID-19 Vaccine (4 - Booster) 02/19/2020   Pneumonia Vaccine 24+ Years old (3) 12/16/2020    Colorectal cancer screening: Type of screening: Colonoscopy. Completed 10/14/20. Repeat every 5 years  Lung Cancer  Screening: (Low Dose CT Chest recommended if Age 50-80 years, 30 pack-year  currently smoking OR have quit w/in 15years.) does not qualify.   Additional Screening:  Hepatitis C Screening: does qualify; Completed 12/11/18  Vision Screening: Recommended annual ophthalmology exams for early detection of glaucoma and other disorders of the eye. Is the patient up to date with their annual eye exam?  Yes  Who is the provider or what is the name of the office in which the patient attends annual eye exams? [facility].   Dental Screening: Recommended annual dental exams for proper oral hygiene  Community Resource Referral / Chronic Care Management: CRR required this visit?  No   CCM required this visit?  No      Plan:     I have personally reviewed and noted the following in the patient's chart:   Medical and social history Use of alcohol, tobacco or illicit drugs  Current medications and supplements including opioid prescriptions. Patient is not currently taking opioid prescriptions. Functional ability and status Nutritional status Physical activity Advanced directives List of other physicians Hospitalizations, surgeries, and ER visits in previous 12 months Vitals Screenings to include cognitive, depression, and falls Referrals and appointments  In addition, I have reviewed and discussed with patient certain preventive protocols, quality metrics, and best practice recommendations. A written personalized care plan for preventive services as well as general preventive health recommendations were provided to patient.     Clemetine Marker, LPN   [id number]   Nurse Notes: none

## 2021-12-15 DIAGNOSIS — D485 Neoplasm of uncertain behavior of skin: Secondary | ICD-10-CM | POA: Diagnosis not present

## 2021-12-15 DIAGNOSIS — L570 Actinic keratosis: Secondary | ICD-10-CM | POA: Diagnosis not present

## 2021-12-15 DIAGNOSIS — D045 Carcinoma in situ of skin of trunk: Secondary | ICD-10-CM | POA: Diagnosis not present

## 2021-12-31 IMAGING — CR DG LUMBAR SPINE COMPLETE 4+V
5 series · 5 of 5 positions shown · non-contrast
Comparison: None.

CLINICAL DATA: Left-sided low back pain over the last month.

EXAM:
LUMBAR SPINE - COMPLETE 4+ VIEW

[l-spine ap]
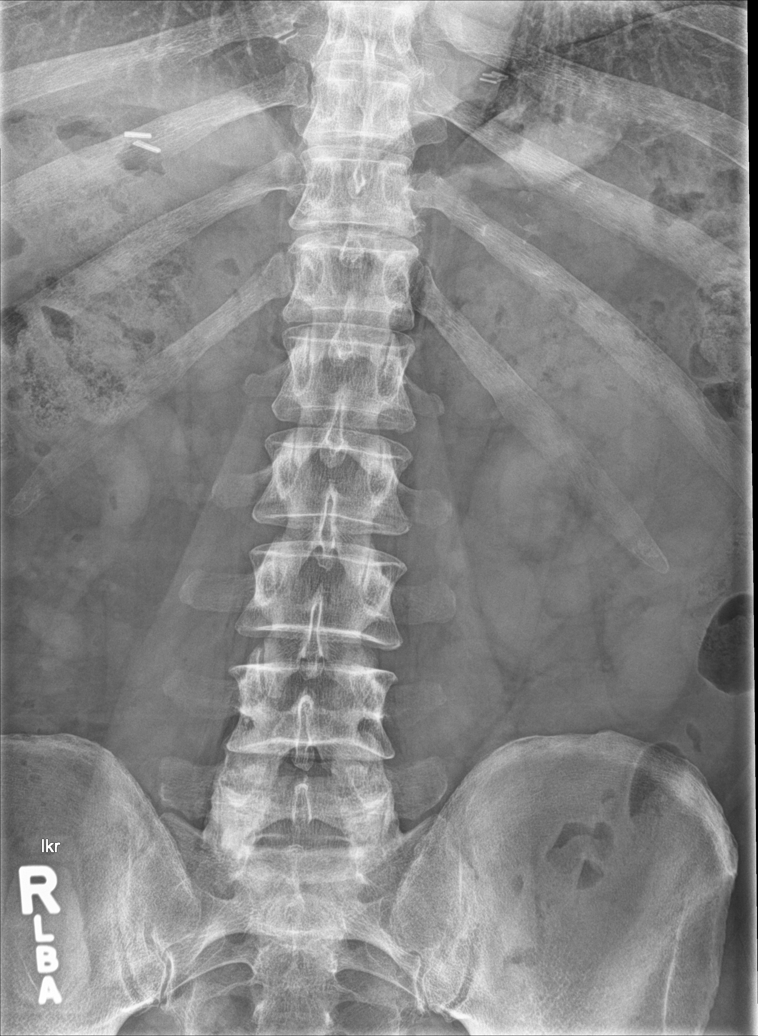

[l-spine obl (1 of 2)]
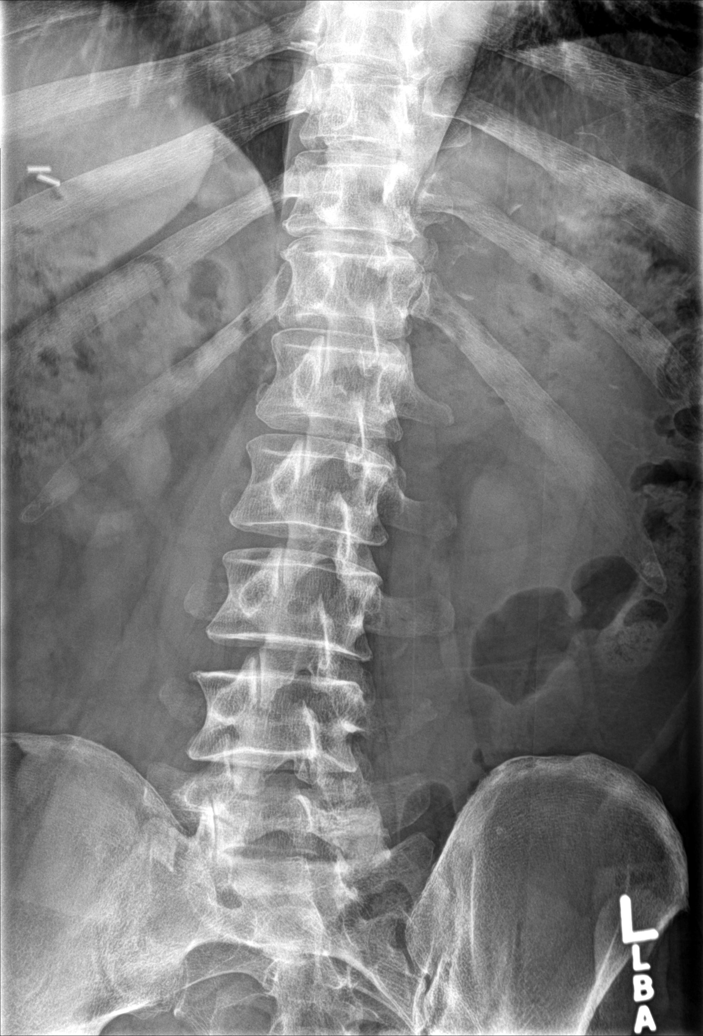

[l-spine obl (2 of 2)]
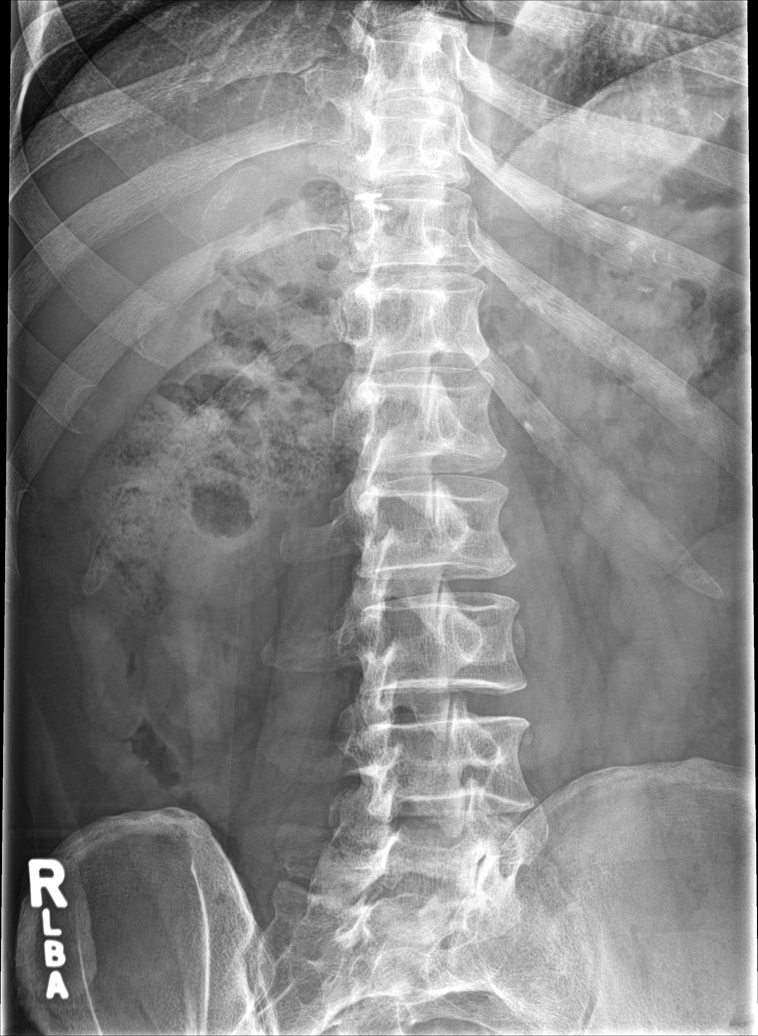

[l-spine lat]
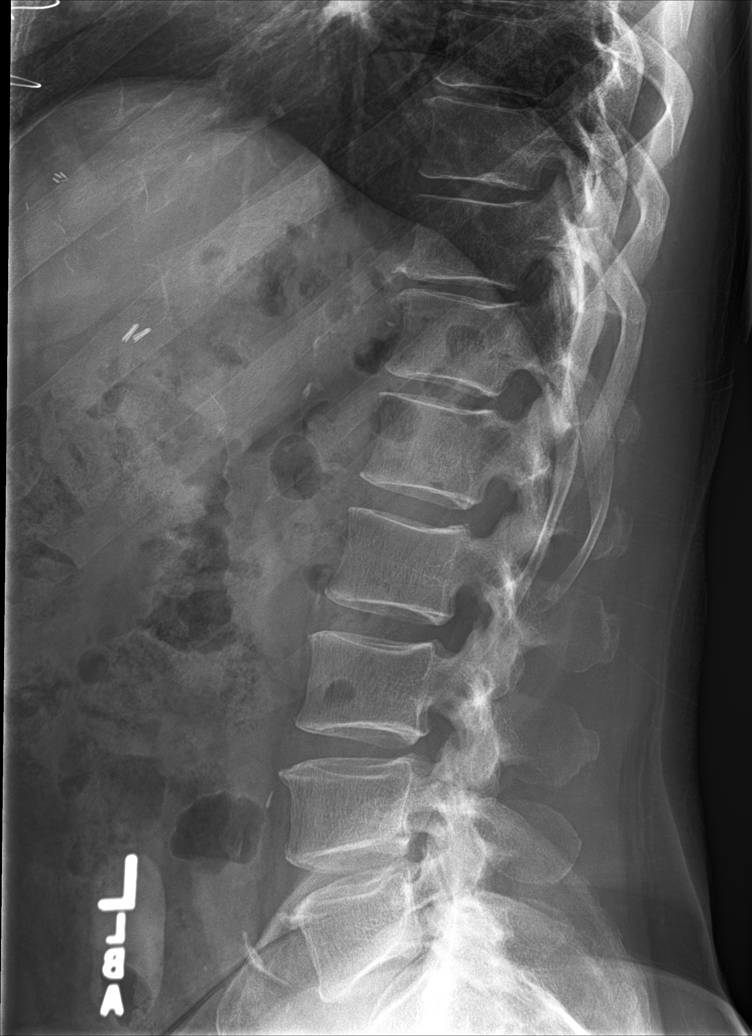

[l-spine spot]
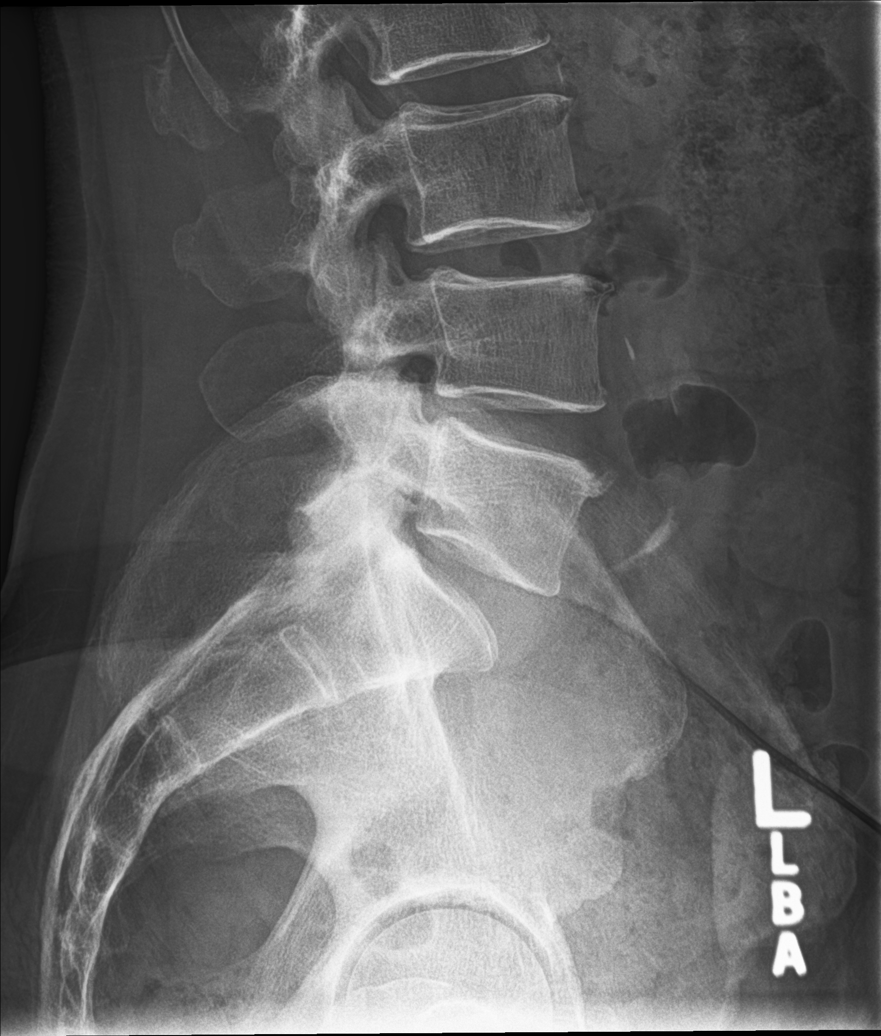

[5 of 5 positions shown; findings below may reference images not displayed]

FINDINGS: Five lumbar type vertebral bodies show normal alignment. No
pronounced disc space narrowing. Minimal disc space narrowing L4-5.
Mild facet osteoarthritis at L5-S1 could be a cause of low back
pain. No other finding of note. Minimal arterial calcification.
IMPRESSION: Very mild disc space narrowing at L4-5. Mild facet osteoarthritis at
L5-S1, which could possibly be symptomatic.

## 2022-01-03 ENCOUNTER — Ambulatory Visit (INDEPENDENT_AMBULATORY_CARE_PROVIDER_SITE_OTHER): Payer: Medicare Other | Admitting: Family Medicine

## 2022-01-03 ENCOUNTER — Encounter: Payer: Self-pay | Admitting: Family Medicine

## 2022-01-03 VITALS — BP 118/76 | HR 67 | Ht 72.0 in | Wt 236.0 lb

## 2022-01-03 DIAGNOSIS — E785 Hyperlipidemia, unspecified: Secondary | ICD-10-CM | POA: Diagnosis not present

## 2022-01-03 DIAGNOSIS — Z23 Encounter for immunization: Secondary | ICD-10-CM | POA: Diagnosis not present

## 2022-01-03 DIAGNOSIS — Z0000 Encounter for general adult medical examination without abnormal findings: Secondary | ICD-10-CM | POA: Diagnosis not present

## 2022-01-03 DIAGNOSIS — Z1211 Encounter for screening for malignant neoplasm of colon: Secondary | ICD-10-CM

## 2022-01-03 LAB — POCT URINALYSIS DIPSTICK
Bilirubin, UA: NEGATIVE
Blood, UA: NEGATIVE
Glucose, UA: NEGATIVE
Ketones, UA: NEGATIVE
Leukocytes, UA: NEGATIVE
Nitrite, UA: NEGATIVE
Protein, UA: NEGATIVE
Spec Grav, UA: 1.01 (ref 1.010–1.025)
Urobilinogen, UA: 0.2 E.U./dL
pH, UA: 6 (ref 5.0–8.0)

## 2022-01-03 LAB — HEMOCCULT GUIAC POC 1CARD (OFFICE): Fecal Occult Blood, POC: NEGATIVE

## 2022-01-03 NOTE — Progress Notes (Unsigned)
Date:  01/03/2022   Name:  Dustin Arnold   DOB:  10-Apr-1955   MRN:  [id number]   Chief Complaint: No chief complaint on file.  Patient is a 66 year old male who presents for a comprehensive physical exam. The patient reports the following problems: knee pain. Health maintenance has been reviewed up to date.      Lab Results  Component Value Date   NA 140 12/30/2020   K 4.6 12/30/2020   CO2 24 12/30/2020   GLUCOSE 86 12/30/2020   BUN 15 12/30/2020   CREATININE 1.06 12/30/2020   CALCIUM 9.2 12/30/2020   EGFR 77 12/30/2020   GFRNONAA 64 12/17/2019   Lab Results  Component Value Date   CHOL 148 12/30/2020   HDL 55 12/30/2020   LDLCALC 78 12/30/2020   TRIG 75 12/30/2020   CHOLHDL 2.2 12/02/2015   No results found for: "TSH" No results found for: "HGBA1C" Lab Results  Component Value Date   WBC 6.4 12/17/2019   HGB 13.9 12/17/2019   HCT 41.5 12/17/2019   MCV 94 12/17/2019   PLT 189 12/17/2019   Lab Results  Component Value Date   ALT 40 12/30/2020   AST 33 12/30/2020   ALKPHOS 63 12/30/2020   BILITOT 0.8 12/30/2020   No results found for: "25OHVITD2", "25OHVITD3", "VD25OH"   Review of Systems  Constitutional:  Negative for chills and fever.  HENT:  Negative for drooling, ear discharge, ear pain and sore throat.   Respiratory:  Negative for cough, shortness of breath and wheezing.   Cardiovascular:  Negative for chest pain, palpitations and leg swelling.  Gastrointestinal:  Negative for abdominal pain, blood in stool, constipation, diarrhea and nausea.  Endocrine: Negative for polydipsia.  Genitourinary:  Negative for dysuria, frequency, hematuria and urgency.  Musculoskeletal:  Negative for back pain, myalgias and neck pain.  Skin:  Negative for rash.  Allergic/Immunologic: Negative for environmental allergies.  Neurological:  Negative for dizziness and headaches.  Hematological:  Does not bruise/bleed easily.  Psychiatric/Behavioral:  Negative for  suicidal ideas. The patient is not nervous/anxious.     Patient Active Problem List   Diagnosis Date Noted   Colon polyp 01/19/2019   Prostate cancer screening 11/30/2014   H/O coronary artery bypass surgery 11/06/2013   Presence of aortocoronary bypass graft 11/06/2013   Arteriosclerosis of autologous vein coronary artery bypass graft 10/14/2013   Benign essential HTN 10/14/2013   HLD (hyperlipidemia) 10/14/2013    Allergies  Allergen Reactions   Other Other (See Comments)    IVP dye    Past Surgical History:  Procedure Laterality Date   CARDIAC SURGERY     bypass   CHOLECYSTECTOMY     COLONOSCOPY  2006   cleared for [facility]   COLONOSCOPY WITH PROPOFOL N/A 01/07/2015   Procedure: COLONOSCOPY WITH PROPOFOL;  Surgeon: Hulen Luster, MD;  Location: [facility] ENDOSCOPY;  Service: Gastroenterology;  Laterality: N/A;   CORONARY ARTERY BYPASS GRAFT      Social History   Tobacco Use   Smoking status: Never   Smokeless tobacco: Never  Vaping Use   Vaping Use: Never used  Substance Use Topics   Alcohol use: Yes   Drug use: No     Medication list has been reviewed and updated.  Current Meds  Medication Sig   aspirin 81 MG EC tablet Take 1 tablet by mouth daily.    atorvastatin (LIPITOR) 80 MG tablet Take 1 tablet by mouth daily.  loratadine (CLARITIN) 10 MG tablet Take 10 mg by mouth daily. otc   metoprolol succinate (TOPROL-XL) 50 MG 24 hr tablet Take 1 tablet by mouth daily.   Multiple Vitamin (MULTIVITAMIN) capsule Take 1 capsule by mouth daily.       01/03/2022    8:38 AM 12/30/2020   10:01 AM 12/17/2019    8:41 AM 09/02/2019   10:46 AM  GAD 7 : Generalized Anxiety Score  Nervous, Anxious, on Edge 0 0 0   Control/stop worrying 0 0 0 0  Worry too much - different things 0 0 0 0  Trouble relaxing 0 0 0 0  Restless 0 0 0 0  Easily annoyed or irritable 0 0 0 0  Afraid - awful might happen 0 0 0 0  Total GAD 7 Score 0 0 0   Anxiety Difficulty Not difficult at all   Not  difficult at all       01/03/2022    8:38 AM 08/16/2021    8:26 AM 12/30/2020   10:01 AM  Depression screen PHQ 2/9  Decreased Interest 0 0 0  Down, Depressed, Hopeless 0 0 0  PHQ - 2 Score 0 0 0  Altered sleeping 0  0  Tired, decreased energy 0  0  Change in appetite 0  0  Feeling bad or failure about yourself  0  0  Trouble concentrating 0  0  Moving slowly or fidgety/restless 0  0  Suicidal thoughts 0  0  PHQ-9 Score 0  0  Difficult doing work/chores Not difficult at all      BP Readings from Last 3 Encounters:  01/03/22 122/82  03/02/21 110/80  12/30/20 120/74    Physical Exam Vitals and nursing note reviewed.  Constitutional:      Appearance: He is well-groomed and normal weight.  HENT:     Head: Normocephalic.     Right Ear: Hearing, tympanic membrane, ear canal and external ear normal.     Left Ear: Hearing, tympanic membrane, ear canal and external ear normal.     Nose: Nose normal. No congestion or rhinorrhea.     Mouth/Throat:     Mouth: Mucous membranes are moist.     Dentition: Normal dentition.     Tongue: No lesions.     Palate: No mass.     Pharynx: Oropharynx is clear. Uvula midline.  Eyes:     General: Lids are normal. Vision grossly intact. Gaze aligned appropriately. No scleral icterus.       Right eye: No discharge.        Left eye: No discharge.     Extraocular Movements: Extraocular movements intact.     Conjunctiva/sclera: Conjunctivae normal.     Pupils: Pupils are equal, round, and reactive to light.     Funduscopic exam:    Right eye: Red reflex present.        Left eye: Red reflex present. Neck:     Thyroid: No thyromegaly.     Vascular: No JVD.     Trachea: No tracheal deviation.  Cardiovascular:     Rate and Rhythm: Normal rate and regular rhythm.     Pulses:          Carotid pulses are 2+ on the right side and 2+ on the left side.      Radial pulses are 2+ on the right side and 2+ on the left side.       Femoral pulses are 2+  on  the right side and 2+ on the left side.      Popliteal pulses are 2+ on the right side and 2+ on the left side.       Dorsalis pedis pulses are 2+ on the right side and 2+ on the left side.       Posterior tibial pulses are 2+ on the right side and 2+ on the left side.     Heart sounds: Normal heart sounds, S1 normal and S2 normal. No murmur heard.    No systolic murmur is present.     No diastolic murmur is present.     No friction rub. No gallop. No S3 or S4 sounds.  Pulmonary:     Effort: No respiratory distress.     Breath sounds: Normal breath sounds and air entry. No decreased breath sounds, wheezing, rhonchi or rales.  Chest:     Chest wall: No mass.  Breasts:    Right: Normal. No mass.     Left: Normal. No mass.  Abdominal:     General: Bowel sounds are normal.     Palpations: Abdomen is soft. There is no hepatomegaly, splenomegaly or mass.     Tenderness: There is no abdominal tenderness. There is no guarding or rebound.     Hernia: No hernia is present.  Genitourinary:    Penis: Normal and uncircumcised.      Testes: Normal.        Right: Mass not present.        Left: Mass not present.     Prostate: Normal. Not enlarged, not tender and no nodules present.     Rectum: Normal. Guaiac result negative. No mass.  Musculoskeletal:        General: No tenderness. Normal range of motion.     Cervical back: Normal, full passive range of motion without pain, normal range of motion and neck supple.     Thoracic back: Normal.     Lumbar back: Normal.     Right lower leg: No edema.     Left lower leg: No edema.  Lymphadenopathy:     Head:     Right side of head: No submental adenopathy.     Cervical: No cervical adenopathy.     Upper Body:     Right upper body: No supraclavicular or axillary adenopathy.     Left upper body: No supraclavicular or axillary adenopathy.  Skin:    General: Skin is warm.     Capillary Refill: Capillary refill takes less than 2 seconds.      Findings: No rash.  Neurological:     General: No focal deficit present.     Mental Status: He is alert and oriented to person, place, and time.     Cranial Nerves: Cranial nerves 2-12 are intact. No cranial nerve deficit.     Sensory: Sensation is intact.     Motor: Motor function is intact.     Coordination: Romberg sign negative.     Deep Tendon Reflexes: Reflexes are normal and symmetric.     Reflex Scores:      Tricep reflexes are 2+ on the right side and 2+ on the left side.      Bicep reflexes are 2+ on the right side and 2+ on the left side.      Brachioradialis reflexes are 2+ on the right side and 2+ on the left side.      Patellar reflexes are 2+ on the right side and  2+ on the left side.      Achilles reflexes are 2+ on the right side and 2+ on the left side. Psychiatric:        Behavior: Behavior is cooperative.     Wt Readings from Last 3 Encounters:  01/03/22 236 lb (107 kg)  03/02/21 240 lb (108.9 kg)  12/30/20 235 lb (106.6 kg)    BP 122/82   Pulse 67   Ht 6' (1.829 m)   Wt 236 lb (107 kg)   SpO2 98%   BMI 32.01 kg/m   Assessment and Plan:  DELANTE KARAPETYAN is a 66 y.o. male who presents today for his Complete Annual Exam. He feels well. He reports exercising walks daily. He reports he is sleeping well.  Immunizations are reviewed and recommendations provided.   Age appropriate screening tests are discussed. Counseling given for risk factor reduction interventions.  1. Annual physical exam No subjective/objective concerns noted during history of present illness, review of past medical history medications, review of systems, and physical exam. - POCT Occult Blood Stool - Comprehensive Metabolic Panel (CMET) - PSA - POCT urinalysis dipstick  2. Hyperlipidemia, unspecified hyperlipidemia type Mild elevation of lipids noted controlled with dietary approach we will check lipid panel for current status. - Comprehensive Metabolic Panel (CMET) - Lipid Panel With  LDL/HDL Ratio  3. Colon cancer screening DRE was done and there was no enlargement of the prostate or rectal mass occult was negative. - POCT Occult Blood Stool  4. Need for immunization against influenza Discussed and administered - Flu Vaccine QUAD High Dose(Fluad)  5. Need for pneumococcal vaccination Discussed and administered - Pneumococcal conjugate vaccine 20-valent (Prevnar 20)   Otilio Miu, MD

## 2022-01-03 NOTE — Patient Instructions (Signed)
Why follow it? Research shows. Those who follow the Mediterranean diet have a reduced risk of heart disease  The diet is associated with a reduced incidence of Parkinson's and Alzheimer's diseases People following the diet may have longer life expectancies and lower rates of chronic diseases  The Dietary Guidelines for Americans recommends the Mediterranean diet as an eating plan to promote health and prevent disease  What Is the Mediterranean Diet?  Healthy eating plan based on typical foods and recipes of Mediterranean-style cooking The diet is primarily a plant based diet; these foods should make up a majority of meals   Starches - Plant based foods should make up a majority of meals - They are an important sources of vitamins, minerals, energy, antioxidants, and fiber - Choose whole grains, foods high in fiber and minimally processed items  - Typical grain sources include wheat, oats, barley, corn, brown Gianino, bulgar, farro, millet, polenta, couscous  - Various types of beans include chickpeas, lentils, fava beans, black beans, white beans   Fruits  Veggies - Large quantities of antioxidant rich fruits & veggies; 6 or more servings  - Vegetables can be eaten raw or lightly drizzled with oil and cooked  - Vegetables common to the traditional Mediterranean Diet include: artichokes, arugula, beets, broccoli, brussel sprouts, cabbage, carrots, celery, collard greens, cucumbers, eggplant, kale, leeks, lemons, lettuce, mushrooms, okra, onions, peas, peppers, potatoes, pumpkin, radishes, rutabaga, shallots, spinach, sweet potatoes, turnips, zucchini - Fruits common to the Mediterranean Diet include: apples, apricots, avocados, cherries, clementines, dates, figs, grapefruits, grapes, melons, nectarines, oranges, peaches, pears, pomegranates, strawberries, tangerines  Fats - Replace butter and margarine with healthy oils, such as olive oil, canola oil, and tahini  - Limit nuts to no more than a  handful a day  - Nuts include walnuts, almonds, pecans, pistachios, pine nuts  - Limit or avoid candied, honey roasted or heavily salted nuts - Olives are central to the Mediterranean diet - can be eaten whole or used in a variety of dishes   Meats Protein - Limiting red meat: no more than a few times a month - When eating red meat: choose lean cuts and keep the portion to the size of deck of cards - Eggs: approx. 0 to 4 times a week  - Fish and lean poultry: at least 2 a week  - Healthy protein sources include, chicken, turkey, lean beef, lamb - Increase intake of seafood such as tuna, salmon, trout, mackerel, shrimp, scallops - Avoid or limit high fat processed meats such as sausage and bacon  Dairy - Include moderate amounts of low fat dairy products  - Focus on healthy dairy such as fat free yogurt, skim milk, low or reduced fat cheese - Limit dairy products higher in fat such as whole or 2% milk, cheese, ice cream  Alcohol - Moderate amounts of red wine is ok  - No more than 5 oz daily for women (all ages) and men older than age 65  - No more than 10 oz of wine daily for men younger than 65  Other - Limit sweets and other desserts  - Use herbs and spices instead of salt to flavor foods  - Herbs and spices common to the traditional Mediterranean Diet include: basil, bay leaves, chives, cloves, cumin, fennel, garlic, lavender, marjoram, mint, oregano, parsley, pepper, rosemary, sage, savory, sumac, tarragon, thyme   It's not just a diet, it's a lifestyle:  The Mediterranean diet includes lifestyle factors typical of those in the   region  Foods, drinks and meals are best eaten with others and savored Daily physical activity is important for overall good health This could be strenuous exercise like running and aerobics This could also be more leisurely activities such as walking, housework, yard-work, or taking the stairs Moderation is the key; a balanced and healthy diet accommodates most  foods and drinks Consider portion sizes and frequency of consumption of certain foods   Meal Ideas & Options:  Breakfast:  Whole wheat toast or whole wheat English muffins with peanut butter & hard boiled egg Steel cut oats topped with apples & cinnamon and skim milk  Fresh fruit: banana, strawberries, melon, berries, peaches  Smoothies: strawberries, bananas, greek yogurt, peanut butter Low fat greek yogurt with blueberries and granola  Egg white omelet with spinach and mushrooms Breakfast couscous: whole wheat couscous, apricots, skim milk, cranberries  Sandwiches:  Hummus and grilled vegetables (peppers, zucchini, squash) on whole wheat bread   Grilled chicken on whole wheat pita with lettuce, tomatoes, cucumbers or tzatziki  Tuna salad on whole wheat bread: tuna salad made with greek yogurt, olives, red peppers, capers, green onions Garlic rosemary lamb pita: lamb sauted with garlic, rosemary, salt & pepper; add lettuce, cucumber, greek yogurt to pita - flavor with lemon juice and black pepper  Seafood:  Mediterranean grilled salmon, seasoned with garlic, basil, parsley, lemon juice and black pepper Shrimp, lemon, and spinach whole-grain pasta salad made with low fat greek yogurt  Seared scallops with lemon orzo  Seared tuna steaks seasoned salt, pepper, coriander topped with tomato mixture of olives, tomatoes, olive oil, minced garlic, parsley, green onions and cappers  Meats:  Herbed greek chicken salad with kalamata olives, cucumber, feta  Red bell peppers stuffed with spinach, bulgur, lean ground beef (or lentils) & topped with feta   Kebabs: skewers of chicken, tomatoes, onions, zucchini, squash  Turkey burgers: made with red onions, mint, dill, lemon juice, feta cheese topped with roasted red peppers Vegetarian Cucumber salad: cucumbers, artichoke hearts, celery, red onion, feta cheese, tossed in olive oil & lemon juice  Hummus and whole grain pita points with a greek salad  (lettuce, tomato, feta, olives, cucumbers, red onion) Lentil soup with celery, carrots made with vegetable broth, garlic, salt and pepper  Tabouli salad: parsley, bulgur, mint, scallions, cucumbers, tomato, radishes, lemon juice, olive oil, salt and pepper.      

## 2022-01-04 ENCOUNTER — Encounter: Payer: Self-pay | Admitting: Family Medicine

## 2022-01-04 DIAGNOSIS — D045 Carcinoma in situ of skin of trunk: Secondary | ICD-10-CM | POA: Diagnosis not present

## 2022-01-04 DIAGNOSIS — L988 Other specified disorders of the skin and subcutaneous tissue: Secondary | ICD-10-CM | POA: Diagnosis not present

## 2022-01-04 LAB — LIPID PANEL WITH LDL/HDL RATIO
Cholesterol, Total: 140 mg/dL (ref 100–199)
HDL: 52 mg/dL (ref >39)
LDL Chol Calc (NIH): 72 mg/dL (ref 0–99)
LDL/HDL Ratio: 1.4 ratio (ref 0.0–3.6)
Triglycerides: 81 mg/dL (ref 0–149)
VLDL Cholesterol Cal: 16 mg/dL (ref 5–40)

## 2022-01-04 LAB — COMPREHENSIVE METABOLIC PANEL
ALT: 44 IU/L (ref 0–44)
AST: 38 IU/L (ref 0–40)
Albumin/Globulin Ratio: 1.9 (ref 1.2–2.2)
Albumin: 4.3 g/dL (ref 3.9–4.9)
Alkaline Phosphatase: 70 IU/L (ref 44–121)
BUN/Creatinine Ratio: 10 (ref 10–24)
BUN: 11 mg/dL (ref 8–27)
Bilirubin Total: 0.8 mg/dL (ref 0.0–1.2)
CO2: 24 mmol/L (ref 20–29)
Calcium: 9.6 mg/dL (ref 8.6–10.2)
Chloride: 105 mmol/L (ref 96–106)
Creatinine, Ser: 1.13 mg/dL (ref 0.76–1.27)
Globulin, Total: 2.3 g/dL (ref 1.5–4.5)
Glucose: 103 mg/dL — ABNORMAL HIGH (ref 70–99)
Potassium: 5 mmol/L (ref 3.5–5.2)
Sodium: 144 mmol/L (ref 134–144)
Total Protein: 6.6 g/dL (ref 6.0–8.5)
eGFR: 71 mL/min/{1.73_m2} (ref >59)

## 2022-01-04 LAB — PSA: Prostate Specific Ag, Serum: 0.6 ng/mL (ref 0.0–4.0)

## 2022-01-17 DIAGNOSIS — I10 Essential (primary) hypertension: Secondary | ICD-10-CM | POA: Diagnosis not present

## 2022-01-17 DIAGNOSIS — Z951 Presence of aortocoronary bypass graft: Secondary | ICD-10-CM | POA: Diagnosis not present

## 2022-04-13 DIAGNOSIS — I10 Essential (primary) hypertension: Secondary | ICD-10-CM | POA: Diagnosis not present

## 2022-04-13 DIAGNOSIS — H2513 Age-related nuclear cataract, bilateral: Secondary | ICD-10-CM | POA: Diagnosis not present

## 2022-06-06 DIAGNOSIS — K080 Exfoliation of teeth due to systemic causes: Secondary | ICD-10-CM | POA: Diagnosis not present

## 2022-07-05 ENCOUNTER — Ambulatory Visit: Payer: Self-pay

## 2022-07-05 DIAGNOSIS — Y9301 Activity, walking, marching and hiking: Secondary | ICD-10-CM | POA: Diagnosis not present

## 2022-07-05 DIAGNOSIS — W010XXA Fall on same level from slipping, tripping and stumbling without subsequent striking against object, initial encounter: Secondary | ICD-10-CM | POA: Diagnosis not present

## 2022-07-05 DIAGNOSIS — S0101XA Laceration without foreign body of scalp, initial encounter: Secondary | ICD-10-CM | POA: Diagnosis not present

## 2022-07-05 DIAGNOSIS — Y92830 Public park as the place of occurrence of the external cause: Secondary | ICD-10-CM | POA: Diagnosis not present

## 2022-07-05 DIAGNOSIS — I10 Essential (primary) hypertension: Secondary | ICD-10-CM | POA: Diagnosis not present

## 2022-07-05 DIAGNOSIS — R296 Repeated falls: Secondary | ICD-10-CM | POA: Diagnosis not present

## 2022-07-05 DIAGNOSIS — Z720 Tobacco use: Secondary | ICD-10-CM | POA: Diagnosis not present

## 2022-07-05 NOTE — Telephone Encounter (Signed)
  Chief Complaint: fall Symptoms: fall d/t tripped on rock during hiking trip. HA, lacerations to back of head and weakness Frequency: today Pertinent Negatives: NA Disposition: ED /[] Urgent Care (no appt availability in office) / Appointment(In office/virtual)/  [facility]/ Home Care/ Refused Recommended Disposition /[] [facility]/  Follow-up with PCP Additional Notes: pt's wife calling d/t pt called her. He was hiking with a group of people at [employer] and tripped and fell. Hit head on rock and has gash and laceration to back of head as well as HA. Pt is oriented but sounded weak, was with a nurse who bandaged the area but felt like might need stitches. Pt wanted to come to office to see PCP. Advised her pt would need to go ED for evaluation. She verbalized understanding and was going to call pt back.   Reason for Disposition  Injury (or injuries) that need emergency care  Answer Assessment - Initial Assessment Questions 1. MECHANISM: "How did the fall happen?"     Fell d/t slipped on a rock and hit head  3. ONSET: "When did the fall happen?" (e.g., minutes, hours, or days ago)     Today  4. LOCATION: "What part of the body hit the ground?" (e.g., back, buttocks, head, hips, knees, hands, head, stomach)     Head  5. INJURY: "Did you hurt (injure) yourself when you fell?" If Yes, ask: "What did you injure? Tell me more about this?" (e.g., body area; type of injury; pain severity)"     Head injury 7. SIZE: For cuts, bruises, or swelling, ask: "How large is it?" (e.g., inches or centimeters)      Lacerations in back of head  9. OTHER SYMPTOMS: "Do you have any other symptoms?" (e.g., dizziness, fever, weakness; new onset or worsening).      Weakness, HA  10. CAUSE: "What do you think caused the fall (or falling)?" (e.g., tripped, dizzy spell)       Tripped  Protocols used: Falls and [facility]

## 2022-08-22 ENCOUNTER — Ambulatory Visit (INDEPENDENT_AMBULATORY_CARE_PROVIDER_SITE_OTHER): Payer: Medicare Other

## 2022-08-22 VITALS — Ht 72.0 in | Wt 236.0 lb

## 2022-08-22 DIAGNOSIS — Z0000 Encounter for general adult medical examination without abnormal findings: Secondary | ICD-10-CM | POA: Diagnosis not present

## 2022-08-22 DIAGNOSIS — I10 Essential (primary) hypertension: Secondary | ICD-10-CM | POA: Diagnosis not present

## 2022-08-22 DIAGNOSIS — E7800 Pure hypercholesterolemia, unspecified: Secondary | ICD-10-CM | POA: Diagnosis not present

## 2022-08-22 DIAGNOSIS — Z951 Presence of aortocoronary bypass graft: Secondary | ICD-10-CM | POA: Diagnosis not present

## 2022-08-22 NOTE — Patient Instructions (Signed)
[name] , Thank you for taking time to come for your Medicare Wellness Visit. I appreciate your ongoing commitment to your health goals. Please review the following plan we discussed and let me know if I can assist you in the future.   These are the goals we discussed:  Goals      DIET - EAT MORE FRUITS AND VEGETABLES     Patient Stated     Patient states he would like to maintain health, activity level and traveling.         This is a list of the screening recommended for you and due dates:  Health Maintenance  Topic Date Due   Zoster (Shingles) Vaccine (1 of 2) Never done   COVID-19 Vaccine (4 - 2023-24 season) 11/10/2021   Flu Shot  10/11/2022   Medicare Annual Wellness Visit  08/22/2023   Colon Cancer Screening  10/14/2025   DTaP/Tdap/Td vaccine (3 - Td or Tdap) 01/26/2030   Pneumonia Vaccine  Completed   Hepatitis C Screening  Completed   HPV Vaccine  Aged Out    Advanced directives: no  Conditions/risks identified: none  Next appointment: Follow up in one year for your annual wellness visit. 08/28/23 @ 11:15 am by phone  Preventive Care 65 Years and Older, Male  Preventive care refers to lifestyle choices and visits with your health care provider that can promote health and wellness. What does preventive care include? A yearly physical exam. This is also called an annual well check. Dental exams once or twice a year. Routine eye exams. Ask your health care provider how often you should have your eyes checked. Personal lifestyle choices, including: Daily care of your teeth and gums. Regular physical activity. Eating a healthy diet. Avoiding tobacco and drug use. Limiting alcohol use. Practicing safe sex. Taking low doses of aspirin every day. Taking vitamin and mineral supplements as recommended by your health care provider. What happens during an annual well check? The services and screenings done by your health care provider during your annual well check will  depend on your age, overall health, lifestyle risk factors, and family history of disease. Counseling  Your health care provider may ask you questions about your: Alcohol use. Tobacco use. Drug use. Emotional well-being. Home and relationship well-being. Sexual activity. Eating habits. History of falls. Memory and ability to understand (cognition). Work and work Astronomer. Screening  You may have the following tests or measurements: Height, weight, and BMI. Blood pressure. Lipid and cholesterol levels. These may be checked every 5 years, or more frequently if you are over 64 years old. Skin check. Lung cancer screening. You may have this screening every year starting at age 68 if you have a 30-pack-year history of smoking and currently smoke or have quit within the past 15 years. Fecal occult blood test (FOBT) of the stool. You may have this test every year starting at age 68. Flexible sigmoidoscopy or colonoscopy. You may have a sigmoidoscopy every 5 years or a colonoscopy every 10 years starting at age 68. Prostate cancer screening. Recommendations will vary depending on your family history and other risks. Hepatitis C blood test. Hepatitis B blood test. Sexually transmitted disease (STD) testing. Diabetes screening. This is done by checking your blood sugar (glucose) after you have not eaten for a while (fasting). You may have this done every 1-3 years. Abdominal aortic aneurysm (AAA) screening. You may need this if you are a current or former smoker. Osteoporosis. You may be screened starting  at age 68 if you are at high risk. Talk with your health care provider about your test results, treatment options, and if necessary, the need for more tests. Vaccines  Your health care provider may recommend certain vaccines, such as: Influenza vaccine. This is recommended every year. Tetanus, diphtheria, and acellular pertussis (Tdap, Td) vaccine. You may need a Td booster every 10  years. Zoster vaccine. You may need this after age 68. Pneumococcal 13-valent conjugate (PCV13) vaccine. One dose is recommended after age 68. Pneumococcal polysaccharide (PPSV23) vaccine. One dose is recommended after age 68. Talk to your health care provider about which screenings and vaccines you need and how often you need them. This information is not intended to replace advice given to you by your health care provider. Make sure you discuss any questions you have with your health care provider. Document Released: 03/25/2015 Document Revised: 11/16/2015 Document Reviewed: 12/28/2014 Elsevier Interactive Patient Education  2017 [place] Prevention in the Home Falls can cause injuries. They can happen to people of all ages. There are many things you can do to make your home safe and to help prevent falls. What can I do on the outside of my home? Regularly fix the edges of walkways and driveways and fix any cracks. Remove anything that might make you trip as you walk through a door, such as a raised step or threshold. Trim any bushes or trees on the path to your home. Use bright outdoor lighting. Clear any walking paths of anything that might make someone trip, such as rocks or tools. Regularly check to see if handrails are loose or broken. Make sure that both sides of any steps have handrails. Any raised decks and porches should have guardrails on the edges. Have any leaves, snow, or ice cleared regularly. Use sand or salt on walking paths during winter. Clean up any spills in your garage right away. This includes oil or grease spills. What can I do in the bathroom? Use night lights. Install grab bars by the toilet and in the tub and shower. Do not use towel bars as grab bars. Use non-skid mats or decals in the tub or shower. If you need to sit down in the shower, use a plastic, non-slip stool. Keep the floor dry. Clean up any water that spills on the floor as soon as it  happens. Remove soap buildup in the tub or shower regularly. Attach bath mats securely with double-sided non-slip rug tape. Do not have throw rugs and other things on the floor that can make you trip. What can I do in the bedroom? Use night lights. Make sure that you have a light by your bed that is easy to reach. Do not use any sheets or blankets that are too big for your bed. They should not hang down onto the floor. Have a firm chair that has side arms. You can use this for support while you get dressed. Do not have throw rugs and other things on the floor that can make you trip. What can I do in the kitchen? Clean up any spills right away. Avoid walking on wet floors. Keep items that you use a lot in easy-to-reach places. If you need to reach something above you, use a strong step stool that has a grab bar. Keep electrical cords out of the way. Do not use floor polish or wax that makes floors slippery. If you must use wax, use non-skid floor wax. Do not have throw rugs  and other things on the floor that can make you trip. What can I do with my stairs? Do not leave any items on the stairs. Make sure that there are handrails on both sides of the stairs and use them. Fix handrails that are broken or loose. Make sure that handrails are as long as the stairways. Check any carpeting to make sure that it is firmly attached to the stairs. Fix any carpet that is loose or worn. Avoid having throw rugs at the top or bottom of the stairs. If you do have throw rugs, attach them to the floor with carpet tape. Make sure that you have a light switch at the top of the stairs and the bottom of the stairs. If you do not have them, ask someone to add them for you. What else can I do to help prevent falls? Wear shoes that: Do not have high heels. Have rubber bottoms. Are comfortable and fit you well. Are closed at the toe. Do not wear sandals. If you use a stepladder: Make sure that it is fully opened.  Do not climb a closed stepladder. Make sure that both sides of the stepladder are locked into place. Ask someone to hold it for you, if possible. Clearly mark and make sure that you can see: Any grab bars or handrails. First and last steps. Where the edge of each step is. Use tools that help you move around (mobility aids) if they are needed. These include: Canes. Walkers. Scooters. Crutches. Turn on the lights when you go into a dark area. Replace any light bulbs as soon as they burn out. Set up your furniture so you have a clear path. Avoid moving your furniture around. If any of your floors are uneven, fix them. If there are any pets around you, be aware of where they are. Review your medicines with your doctor. Some medicines can make you feel dizzy. This can increase your chance of falling. Ask your doctor what other things that you can do to help prevent falls. This information is not intended to replace advice given to you by your health care provider. Make sure you discuss any questions you have with your health care provider. Document Released: 12/23/2008 Document Revised: 08/04/2015 Document Reviewed: 04/02/2014 Elsevier Interactive Patient Education  2017 [employer].

## 2022-08-22 NOTE — Progress Notes (Signed)
I connected with  Dustin Arnold on 08/22/22 by a audio enabled telemedicine application and verified that I am speaking with the correct person using two identifiers.  Patient Location: Home  Provider Location: Office/Clinic  I discussed the limitations of evaluation and management by telemedicine. The patient expressed understanding and agreed to proceed.  Subjective:   Dustin Arnold is a 67 y.o. male who presents for Medicare Annual/Subsequent preventive examination.  Review of Systems     Cardiac Risk Factors include: advanced age (>31men, >43 women);dyslipidemia;male gender;hypertension;obesity (BMI >30kg/m2)     Objective:    Today's Vitals   08/22/22 1313  Weight: 236 lb (107 kg)  Height: 6' (1.829 m)   Body mass index is 32.01 kg/m.     08/22/2022    1:05 PM 08/16/2021    8:27 AM 08/01/2020    9:35 AM 05/18/2017    9:27 AM 01/07/2015    8:58 AM 11/30/2014    8:51 AM  Advanced Directives  Does Patient Have a Medical Advance Directive? No Yes Yes Yes Yes Yes  Type of Special educational needs teacher of [place];Living will Healthcare Power of [place];Living will Living will  Healthcare Power of [place];Living will  Copy of Healthcare Power of Attorney in Chart?  No - copy requested No - copy requested   No - copy requested  Would patient like information on creating a medical advance directive? No - Patient declined         Current Medications (verified) Outpatient Encounter Medications as of 08/22/2022  Medication Sig   aspirin 81 MG EC tablet Take 1 tablet by mouth daily.    atorvastatin (LIPITOR) 80 MG tablet Take 1 tablet by mouth daily.   loratadine (CLARITIN) 10 MG tablet Take 10 mg by mouth daily. otc   metoprolol succinate (TOPROL-XL) 50 MG 24 hr tablet Take 1 tablet by mouth daily.   Multiple Vitamin (MULTIVITAMIN) capsule Take 1 capsule by mouth daily.   No facility-administered encounter medications on file as of 08/22/2022.    Allergies  (verified) Other   History: Past Medical History:  Diagnosis Date   Coronary artery disease    Hyperlipemia    Hypertension    Past Surgical History:  Procedure Laterality Date   CARDIAC SURGERY     bypass   CHOLECYSTECTOMY     COLONOSCOPY  2006   cleared for [facility]   COLONOSCOPY WITH PROPOFOL N/A 01/07/2015   Procedure: COLONOSCOPY WITH PROPOFOL;  Surgeon: Wallace Cullens, MD;  Location: [facility] ENDOSCOPY;  Service: Gastroenterology;  Laterality: N/A;   CORONARY ARTERY BYPASS GRAFT     Family History  Problem Relation Age of Onset   Cancer Father        esophageal   Social History   Socioeconomic History   Marital status: Married    Spouse name: Not on file   Number of children: 1   Years of education: Not on file   Highest education level: Master's degree (e.g., MA, MS, MEng, MEd, MSW, MBA)  Occupational History   Occupation: retired  Tobacco Use   Smoking status: Never   Smokeless tobacco: Never  Vaping Use   Vaping Use: Never used  Substance and Sexual Activity   Alcohol use: Yes   Drug use: No   Sexual activity: Yes  Other Topics Concern   Not on file  Social History Narrative   Not on file   Social Determinants of Health   Financial Resource Strain: Low Risk  (08/22/2022)  Overall Financial Resource Strain (CARDIA)    Difficulty of Paying Living Expenses: Not hard at all  Food Insecurity: No Food Insecurity (08/22/2022)   Hunger Vital Sign    Worried About Running Out of Food in the Last Year: Never true    Ran Out of Food in the Last Year: Never true  Transportation Needs: No Transportation Needs (08/22/2022)   PRAPARE - Administrator, Civil Service (Medical): No    Lack of Transportation (Non-Medical): No  Physical Activity: Sufficiently Active (08/22/2022)   Exercise Vital Sign    Days of Exercise per Week: 3 days    Minutes of Exercise per Session: 120 min  Stress: No Stress Concern Present (08/22/2022)   [employer] of Occupational  Health - Occupational Stress Questionnaire    Feeling of Stress : Not at all  Social Connections: Socially Integrated (08/22/2022)   Social Connection and Isolation Panel [NHANES]    Frequency of Communication with Friends and Family: Twice a week    Frequency of Social Gatherings with Friends and Family: Three times a week    Attends Religious Services: More than 4 times per year    Active Member of Clubs or Organizations: Yes    Attends Engineer, structural: More than 4 times per year    Marital Status: Married    Tobacco Counseling Counseling given: Not Answered   Clinical Intake:  Pre-visit preparation completed: Yes  Pain : No/denies pain     Nutritional Risks: None Diabetes: No  How often do you need to have someone help you when you read instructions, pamphlets, or other written materials from your doctor or pharmacy?: 1 - Never  Diabetic?no  Interpreter Needed?: No  Information entered by :: Kennedy Bucker, LPN   Activities of Daily Living    08/22/2022    1:06 PM 08/21/2022    6:24 PM  In your present state of health, do you have any difficulty performing the following activities:  Hearing? 0 0  Vision? 0 0  Difficulty concentrating or making decisions? 0 0  Walking or climbing stairs? 0 0  Dressing or bathing? 0 0  Doing errands, shopping? 0 0  Preparing Food and eating ? N N  Using the Toilet? N N  In the past six months, have you accidently leaked urine? N N  Do you have problems with loss of bowel control? N N  Managing your Medications? N N  Managing your Finances? N N  Housekeeping or managing your Housekeeping? N N    Patient Care Team: Duanne Limerick, MD as PCP - General (Family Medicine) Marcina Millard, MD as Consulting Physician (Cardiology)  Indicate any recent Medical Services you may have received from other than [facility] providers in the past year (date may be approximate).     Assessment:   This is a routine wellness  examination for [facility].  Hearing/Vision screen Hearing Screening - Comments:: No aids Vision Screening - Comments:: Wears glasses- Dr. Clydene Pugh  Dietary issues and exercise activities discussed: Current Exercise Habits: Home exercise routine, Type of exercise: walking, Time (Minutes): 60, Frequency (Times/Week): 3, Weekly Exercise (Minutes/Week): 180, Intensity: Moderate   Goals Addressed             This Visit's Progress    DIET - EAT MORE FRUITS AND VEGETABLES         Depression Screen    08/22/2022    1:04 PM 01/03/2022    8:38 AM 08/16/2021  8:26 AM 12/30/2020   10:01 AM 08/01/2020    9:32 AM 12/17/2019    8:40 AM 09/02/2019   10:46 AM  PHQ 2/9 Scores  PHQ - 2 Score 0 0 0 0 0 0 0  PHQ- 9 Score 0 0  0  0 1    Fall Risk    08/22/2022    1:06 PM 08/21/2022    6:24 PM 01/03/2022    8:37 AM 08/16/2021    8:28 AM 08/01/2020    9:36 AM  Fall Risk   Falls in the past year? 1  0 0 0  Number falls in past yr: 0 0 0 0 0  Injury with Fall? 1 1 0 0 0  Risk for fall due to : History of fall(s)  No Fall Risks No Fall Risks No Fall Risks  Follow up Falls evaluation completed;Falls prevention discussed  Falls evaluation completed Falls prevention discussed Falls prevention discussed    FALL RISK PREVENTION PERTAINING TO THE HOME:  Any stairs in or around the home? Yes  If so, are there any without handrails? No  Home free of loose throw rugs in walkways, pet beds, electrical cords, etc? Yes  Adequate lighting in your home to reduce risk of falls? Yes   ASSISTIVE DEVICES UTILIZED TO PREVENT FALLS:  Life alert? No  Use of a cane, walker or w/c? No  Grab bars in the bathroom? No  Shower chair or bench in shower? No  Elevated toilet seat or a handicapped toilet? No   Cognitive Function:        08/22/2022    1:08 PM  6CIT Screen  What Year? 0 points  What month? 0 points  What time? 0 points  Count back from 20 0 points  Months in reverse 0 points  Repeat phrase 0  points  Total Score 0 points    Immunizations Immunization History  Administered Date(s) Administered   Fluad Quad(high Dose 65+) 12/17/2019, 12/30/2020, 01/03/2022   Influenza,inj,Quad PF,6+ Mos 11/30/2014, 11/27/2017, 12/11/2018   PFIZER(Purple Top)SARS-COV-2 Vaccination 06/09/2019, 06/23/2019, 12/25/2019   PNEUMOCOCCAL CONJUGATE-20 01/03/2022   Pneumococcal Conjugate-13 12/17/2019   Pneumococcal Polysaccharide-23 11/30/2014   Tdap 11/27/2017   Vaxelis (DTaP,IPV,Hib,HepB) 01/27/2020    TDAP status: Up to date  Flu Vaccine status: Up to date  Pneumococcal vaccine status: Up to date  Covid-19 vaccine status: Completed vaccines  Qualifies for Shingles Vaccine? Yes   Zostavax completed No   Shingrix Completed?: No.    Education has been provided regarding the importance of this vaccine. Patient has been advised to call insurance company to determine out of pocket expense if they have not yet received this vaccine. Advised may also receive vaccine at local pharmacy or Health Dept. Verbalized acceptance and understanding.  Screening Tests Health Maintenance  Topic Date Due   Zoster Vaccines- Shingrix (1 of 2) Never done   COVID-19 Vaccine (4 - 2023-24 season) 11/10/2021   INFLUENZA VACCINE  10/11/2022   Medicare Annual Wellness (AWV)  08/22/2023   Colonoscopy  10/14/2025   DTaP/Tdap/Td (3 - Td or Tdap) 01/26/2030   Pneumonia Vaccine 77+ Years old  Completed   Hepatitis C Screening  Completed   HPV VACCINES  Aged Out    Health Maintenance  Health Maintenance Due  Topic Date Due   Zoster Vaccines- Shingrix (1 of 2) Never done   COVID-19 Vaccine (4 - 2023-24 season) 11/10/2021    Colorectal cancer screening: Type of screening: Colonoscopy. Completed 10/14/20. Repeat every 5  years  Lung Cancer Screening: (Low Dose CT Chest recommended if Age 26-80 years, 30 pack-year currently smoking OR have quit w/in 15years.) does not qualify.    Additional Screening:  Hepatitis C  Screening: does qualify; Completed 12/11/18  Vision Screening: Recommended annual ophthalmology exams for early detection of glaucoma and other disorders of the eye. Is the patient up to date with their annual eye exam?  Yes  Who is the provider or what is the name of the office in which the patient attends annual eye exams? Dr.Woodard If pt is not established with a provider, would they like to be referred to a provider to establish care? No .   Dental Screening: Recommended annual dental exams for proper oral hygiene  Community Resource Referral / Chronic Care Management: CRR required this visit?  No   CCM required this visit?  No      Plan:     I have personally reviewed and noted the following in the patient's chart:   Medical and social history Use of alcohol, tobacco or illicit drugs  Current medications and supplements including opioid prescriptions. Patient is not currently taking opioid prescriptions. Functional ability and status Nutritional status Physical activity Advanced directives List of other physicians Hospitalizations, surgeries, and ER visits in previous 12 months Vitals Screenings to include cognitive, depression, and falls Referrals and appointments  In addition, I have reviewed and discussed with patient certain preventive protocols, quality metrics, and best practice recommendations. A written personalized care plan for preventive services as well as general preventive health recommendations were provided to patient.     Hal Hope, LPN   1/61/0960   Nurse Notes: none

## 2022-09-12 DIAGNOSIS — Z950 Presence of cardiac pacemaker: Secondary | ICD-10-CM | POA: Diagnosis not present

## 2022-09-12 DIAGNOSIS — I25810 Atherosclerosis of coronary artery bypass graft(s) without angina pectoris: Secondary | ICD-10-CM | POA: Diagnosis not present

## 2022-09-12 DIAGNOSIS — Z951 Presence of aortocoronary bypass graft: Secondary | ICD-10-CM | POA: Diagnosis not present

## 2022-10-17 DIAGNOSIS — K080 Exfoliation of teeth due to systemic causes: Secondary | ICD-10-CM | POA: Diagnosis not present

## 2022-10-24 DIAGNOSIS — K080 Exfoliation of teeth due to systemic causes: Secondary | ICD-10-CM | POA: Diagnosis not present

## 2023-01-07 ENCOUNTER — Encounter: Payer: Self-pay | Admitting: Family Medicine

## 2023-01-07 ENCOUNTER — Ambulatory Visit (INDEPENDENT_AMBULATORY_CARE_PROVIDER_SITE_OTHER): Payer: Medicare Other | Admitting: Family Medicine

## 2023-01-07 VITALS — BP 120/74 | HR 67 | Ht 72.0 in | Wt 243.0 lb

## 2023-01-07 DIAGNOSIS — Z23 Encounter for immunization: Secondary | ICD-10-CM

## 2023-01-07 DIAGNOSIS — Z0000 Encounter for general adult medical examination without abnormal findings: Secondary | ICD-10-CM

## 2023-01-07 NOTE — Progress Notes (Signed)
Date:  01/07/2023   Name:  Dustin Arnold   DOB:  [birth year]   MRN:  [id number]   Chief Complaint: Annual Exam  Patient is a 67 year old male who presents for a comprehensive physical exam. The patient reports the following problems: none. Health maintenance has been reviewed up to date.      Lab Results  Component Value Date   NA 144 01/03/2022   K 5.0 01/03/2022   CO2 24 01/03/2022   GLUCOSE 103 (H) 01/03/2022   BUN 11 01/03/2022   CREATININE 1.13 01/03/2022   CALCIUM 9.6 01/03/2022   EGFR 71 01/03/2022   GFRNONAA 64 12/17/2019   Lab Results  Component Value Date   CHOL 140 01/03/2022   HDL 52 01/03/2022   LDLCALC 72 01/03/2022   TRIG 81 01/03/2022   CHOLHDL 2.2 12/02/2015   No results found for: "TSH" No results found for: "HGBA1C" Lab Results  Component Value Date   WBC 6.4 12/17/2019   HGB 13.9 12/17/2019   HCT 41.5 12/17/2019   MCV 94 12/17/2019   PLT 189 12/17/2019   Lab Results  Component Value Date   ALT 44 01/03/2022   AST 38 01/03/2022   ALKPHOS 70 01/03/2022   BILITOT 0.8 01/03/2022   No results found for: "25OHVITD2", "25OHVITD3", "VD25OH"   Review of Systems  Constitutional:  Negative for chills and fever.  HENT:  Negative for drooling, ear discharge, ear pain and sore throat.   Respiratory:  Negative for cough, shortness of breath and wheezing.   Cardiovascular:  Negative for chest pain, palpitations and leg swelling.  Gastrointestinal:  Negative for abdominal pain, blood in stool, constipation, diarrhea and nausea.  Endocrine: Negative for polydipsia.  Genitourinary:  Negative for dysuria, frequency, hematuria and urgency.  Musculoskeletal:  Negative for back pain, myalgias and neck pain.  Skin:  Negative for rash.  Allergic/Immunologic: Negative for environmental allergies.  Neurological:  Negative for dizziness and headaches.  Hematological:  Does not bruise/bleed easily.  Psychiatric/Behavioral:  Negative for suicidal ideas. The  patient is not nervous/anxious.     Patient Active Problem List   Diagnosis Date Noted   Colon polyp 01/19/2019   Prostate cancer screening 11/30/2014   H/O coronary artery bypass surgery 11/06/2013   Presence of aortocoronary bypass graft 11/06/2013   Arteriosclerosis of autologous vein coronary artery bypass graft 10/14/2013   Benign essential HTN 10/14/2013   HLD (hyperlipidemia) 10/14/2013    Allergies  Allergen Reactions   Other Other (See Comments)    IVP dye    Past Surgical History:  Procedure Laterality Date   CARDIAC SURGERY     bypass   CHOLECYSTECTOMY     COLONOSCOPY  2006   cleared for [facility]   COLONOSCOPY WITH PROPOFOL N/A 01/07/2015   Procedure: COLONOSCOPY WITH PROPOFOL;  Surgeon: Wallace Cullens, MD;  Location: [facility] ENDOSCOPY;  Service: Gastroenterology;  Laterality: N/A;   CORONARY ARTERY BYPASS GRAFT      Social History   Tobacco Use   Smoking status: Never   Smokeless tobacco: Never  Vaping Use   Vaping status: Never Used  Substance Use Topics   Alcohol use: Yes   Drug use: No     Medication list has been reviewed and updated.  Current Meds  Medication Sig   aspirin 81 MG EC tablet Take 1 tablet by mouth daily.    atorvastatin (LIPITOR) 80 MG tablet Take 1 tablet by mouth daily.   loratadine (CLARITIN) 10  MG tablet Take 10 mg by mouth daily. otc   metoprolol succinate (TOPROL-XL) 50 MG 24 hr tablet Take 1 tablet by mouth daily.   Multiple Vitamin (MULTIVITAMIN) capsule Take 1 capsule by mouth daily.       01/07/2023    9:19 AM 01/03/2022    8:38 AM 12/30/2020   10:01 AM 12/17/2019    8:41 AM  GAD 7 : Generalized Anxiety Score  Nervous, Anxious, on Edge 0 0 0 0  Control/stop worrying 0 0 0 0  Worry too much - different things 0 0 0 0  Trouble relaxing 0 0 0 0  Restless 0 0 0 0  Easily annoyed or irritable 0 0 0 0  Afraid - awful might happen 0 0 0 0  Total GAD 7 Score 0 0 0 0  Anxiety Difficulty Not difficult at all Not difficult at all          01/07/2023    9:19 AM 08/22/2022    1:04 PM 01/03/2022    8:38 AM  Depression screen PHQ 2/9  Decreased Interest 0 0 0  Down, Depressed, Hopeless 0 0 0  PHQ - 2 Score 0 0 0  Altered sleeping 0 0 0  Tired, decreased energy 0 0 0  Change in appetite 0 0 0  Feeling bad or failure about yourself  0 0 0  Trouble concentrating 0 0 0  Moving slowly or fidgety/restless 0 0 0  Suicidal thoughts 0 0 0  PHQ-9 Score 0 0 0  Difficult doing work/chores Not difficult at all Not difficult at all Not difficult at all    BP Readings from Last 3 Encounters:  01/07/23 120/74  01/03/22 118/76  03/02/21 110/80    Physical Exam Vitals and nursing note reviewed.  HENT:     Head: Normocephalic.     Jaw: There is normal jaw occlusion.     Right Ear: Hearing, tympanic membrane, ear canal and external ear normal. There is no impacted cerumen.     Left Ear: Hearing, tympanic membrane, ear canal and external ear normal. There is no impacted cerumen.     Nose: Nose normal. No congestion or rhinorrhea.     Mouth/Throat:     Lips: Pink.     Mouth: Mucous membranes are moist.     Dentition: Normal dentition.     Tongue: No lesions.     Palate: No mass.     Pharynx: Oropharynx is clear. Uvula midline.     Tonsils: No tonsillar exudate or tonsillar abscesses.  Eyes:     General: Lids are normal. Vision grossly intact. No scleral icterus.       Right eye: No discharge.        Left eye: No discharge.     Extraocular Movements: Extraocular movements intact.     Conjunctiva/sclera: Conjunctivae normal.     Pupils: Pupils are equal, round, and reactive to light.  Neck:     Thyroid: No thyroid mass, thyromegaly or thyroid tenderness.     Vascular: Normal carotid pulses. No carotid bruit, hepatojugular reflux or JVD.     Trachea: Trachea and phonation normal. No tracheal tenderness, abnormal tracheal secretions or tracheal deviation.  Cardiovascular:     Rate and Rhythm: Normal rate and regular  rhythm.     Chest Wall: PMI is not displaced.     Pulses: Normal pulses.     Heart sounds: Normal heart sounds, S1 normal and S2 normal. Heart sounds not distant.  No murmur heard.    No systolic murmur is present.     No diastolic murmur is present.     No friction rub. No gallop.  Pulmonary:     Effort: No respiratory distress.     Breath sounds: Normal breath sounds. No decreased breath sounds, wheezing, rhonchi or rales.  Chest:  Breasts:    Breasts are symmetrical.     Right: Normal. No mass.     Left: Normal. No mass.  Abdominal:     General: Bowel sounds are normal.     Palpations: Abdomen is soft. There is no hepatomegaly, splenomegaly or mass.     Tenderness: There is no abdominal tenderness. There is no guarding or rebound.     Hernia: No hernia is present.  Genitourinary:    Penis: Uncircumcised.      Testes: Normal.        Right: Mass not present.     Prostate: Normal. Not enlarged, not tender and no nodules present.     Rectum: Normal. Guaiac result negative. No mass.  Musculoskeletal:        General: No tenderness. Normal range of motion.     Cervical back: Normal range of motion and neck supple.     Right lower leg: No edema.     Left lower leg: No edema.  Lymphadenopathy:     Head:     Right side of head: No submandibular or tonsillar adenopathy.     Left side of head: No submandibular or tonsillar adenopathy.     Cervical: No cervical adenopathy.     Right cervical: No superficial, deep or posterior cervical adenopathy.    Left cervical: No superficial, deep or posterior cervical adenopathy.     Upper Body:     Right upper body: No supraclavicular or axillary adenopathy.     Left upper body: No supraclavicular or axillary adenopathy.  Skin:    General: Skin is warm.     Findings: No bruising, erythema or rash.  Neurological:     Mental Status: He is alert and oriented to person, place, and time.     Cranial Nerves: Cranial nerves 2-12 are intact. No  cranial nerve deficit.     Sensory: Sensation is intact.     Motor: Motor function is intact.     Deep Tendon Reflexes: Reflexes are normal and symmetric.     Wt Readings from Last 3 Encounters:  01/07/23 243 lb (110.2 kg)  08/22/22 236 lb (107 kg)  01/03/22 236 lb (107 kg)    BP 120/74   Pulse 67   Ht 6' (1.829 m)   Wt 243 lb (110.2 kg)   SpO2 97%   BMI 32.96 kg/m   Assessment and Plan:  1. Annual physical exam No subjective/objective concerns noted during HPI, review of past medical history/medications/labs, few of systems and physical exam.  Will obtain lipid panel PSA and CMP for electrolytes and other annual labs.  Will recheck patient in 1 year. - Lipid Panel With LDL/HDL Ratio - PSA - Comprehensive metabolic panel  2. Need for influenza vaccination Discussion and administration. - Flu Vaccine Trivalent High Dose (Fluad)    Elizabeth Sauer, MD

## 2023-01-08 LAB — COMPREHENSIVE METABOLIC PANEL
ALT: 44 [IU]/L (ref 0–44)
AST: 40 [IU]/L (ref 0–40)
Albumin: 4 g/dL (ref 3.9–4.9)
Alkaline Phosphatase: 63 [IU]/L (ref 44–121)
BUN/Creatinine Ratio: 11 (ref 10–24)
BUN: 11 mg/dL (ref 8–27)
Bilirubin Total: 0.6 mg/dL (ref 0.0–1.2)
CO2: 24 mmol/L (ref 20–29)
Calcium: 9.1 mg/dL (ref 8.6–10.2)
Chloride: 103 mmol/L (ref 96–106)
Creatinine, Ser: 1.03 mg/dL (ref 0.76–1.27)
Globulin, Total: 2.2 g/dL (ref 1.5–4.5)
Glucose: 100 mg/dL — ABNORMAL HIGH (ref 70–99)
Potassium: 4.5 mmol/L (ref 3.5–5.2)
Sodium: 140 mmol/L (ref 134–144)
Total Protein: 6.2 g/dL (ref 6.0–8.5)
eGFR: 79 mL/min/{1.73_m2} (ref >59)

## 2023-01-08 LAB — PSA: Prostate Specific Ag, Serum: 0.6 ng/mL (ref 0.0–4.0)

## 2023-01-08 LAB — LIPID PANEL WITH LDL/HDL RATIO
Cholesterol, Total: 129 mg/dL (ref 100–199)
HDL: 52 mg/dL (ref >39)
LDL Chol Calc (NIH): 59 mg/dL (ref 0–99)
LDL/HDL Ratio: 1.1 ratio (ref 0.0–3.6)
Triglycerides: 94 mg/dL (ref 0–149)
VLDL Cholesterol Cal: 18 mg/dL (ref 5–40)

## 2023-02-18 DIAGNOSIS — K080 Exfoliation of teeth due to systemic causes: Secondary | ICD-10-CM | POA: Diagnosis not present

## 2023-04-14 ENCOUNTER — Encounter: Payer: Self-pay | Admitting: Emergency Medicine

## 2023-04-14 ENCOUNTER — Ambulatory Visit (INDEPENDENT_AMBULATORY_CARE_PROVIDER_SITE_OTHER): Payer: Medicare Other

## 2023-04-14 ENCOUNTER — Ambulatory Visit
Admission: EM | Admit: 2023-04-14 | Discharge: 2023-04-14 | Disposition: A | Discharge status: Home / Self Care | Payer: Medicare Other | Attending: Physician Assistant | Admitting: Physician Assistant

## 2023-04-14 DIAGNOSIS — M791 Myalgia, unspecified site: Secondary | ICD-10-CM | POA: Diagnosis not present

## 2023-04-14 DIAGNOSIS — B349 Viral infection, unspecified: Secondary | ICD-10-CM | POA: Diagnosis not present

## 2023-04-14 DIAGNOSIS — R5383 Other fatigue: Secondary | ICD-10-CM | POA: Diagnosis not present

## 2023-04-14 DIAGNOSIS — R110 Nausea: Secondary | ICD-10-CM | POA: Diagnosis not present

## 2023-04-14 DIAGNOSIS — R509 Fever, unspecified: Secondary | ICD-10-CM | POA: Diagnosis not present

## 2023-04-14 LAB — URINALYSIS, W/ REFLEX TO CULTURE (INFECTION SUSPECTED)
Glucose, UA: NEGATIVE mg/dL
Hgb urine dipstick: NEGATIVE
Ketones, ur: 15 mg/dL — AB
Leukocytes,Ua: NEGATIVE
Nitrite: NEGATIVE
Protein, ur: 30 mg/dL — AB
RBC / HPF: NONE SEEN RBC/hpf (ref 0–5)
Specific Gravity, Urine: 1.025 (ref 1.005–1.030)
Squamous Epithelial / HPF: NONE SEEN /[HPF] (ref 0–5)
pH: 6 (ref 5.0–8.0)

## 2023-04-14 LAB — CBC WITH DIFFERENTIAL/PLATELET
Abs Immature Granulocytes: 0.02 10*3/uL (ref 0.00–0.07)
Basophils Absolute: 0 10*3/uL (ref 0.0–0.1)
Basophils Relative: 0 %
Eosinophils Absolute: 0.1 10*3/uL (ref 0.0–0.5)
Eosinophils Relative: 2 %
HCT: 45.3 % (ref 39.0–52.0)
Hemoglobin: 14.9 g/dL (ref 13.0–17.0)
Immature Granulocytes: 0 %
Lymphocytes Relative: 9 %
Lymphs Abs: 0.6 10*3/uL — ABNORMAL LOW (ref 0.7–4.0)
MCH: 30.7 pg (ref 26.0–34.0)
MCHC: 32.9 g/dL (ref 30.0–36.0)
MCV: 93.4 fL (ref 80.0–100.0)
Monocytes Absolute: 0.3 10*3/uL (ref 0.1–1.0)
Monocytes Relative: 5 %
Neutro Abs: 5.2 10*3/uL (ref 1.7–7.7)
Neutrophils Relative %: 84 %
Platelets: 158 10*3/uL (ref 150–400)
RBC: 4.85 MIL/uL (ref 4.22–5.81)
RDW: 13.6 % (ref 11.5–15.5)
WBC: 6.1 10*3/uL (ref 4.0–10.5)
nRBC: 0 % (ref 0.0–0.2)

## 2023-04-14 LAB — COMPREHENSIVE METABOLIC PANEL
ALT: 55 U/L — ABNORMAL HIGH (ref 0–44)
AST: 50 U/L — ABNORMAL HIGH (ref 15–41)
Albumin: 3.8 g/dL (ref 3.5–5.0)
Alkaline Phosphatase: 47 U/L (ref 38–126)
Anion gap: 8 (ref 5–15)
BUN: 24 mg/dL — ABNORMAL HIGH (ref 8–23)
CO2: 24 mmol/L (ref 22–32)
Calcium: 8.4 mg/dL — ABNORMAL LOW (ref 8.9–10.3)
Chloride: 102 mmol/L (ref 98–111)
Creatinine, Ser: 1.29 mg/dL — ABNORMAL HIGH (ref 0.61–1.24)
GFR, Estimated: >60 mL/min (ref >60)
Glucose, Bld: 107 mg/dL — ABNORMAL HIGH (ref 70–99)
Potassium: 4.4 mmol/L (ref 3.5–5.1)
Sodium: 134 mmol/L — ABNORMAL LOW (ref 135–145)
Total Bilirubin: 1.4 mg/dL — ABNORMAL HIGH (ref 0.0–1.2)
Total Protein: 6.8 g/dL (ref 6.5–8.1)

## 2023-04-14 LAB — RESP PANEL BY RT-PCR (FLU A&B, COVID) ARPGX2
Influenza A by PCR: NEGATIVE
Influenza B by PCR: NEGATIVE
SARS Coronavirus 2 by RT PCR: NEGATIVE

## 2023-04-14 NOTE — ED Triage Notes (Signed)
Patient reports fatigue, malaise, headaches and bodyaches that started yesterday.  Patient unsure of fevers.  Patient reports some chills.

## 2023-04-14 NOTE — ED Provider Notes (Signed)
[facility]    CSN: [id number] Arrival date & time: 04/14/23  0818      History   Chief Complaint Chief Complaint  Patient presents with   Fatigue    HPI Dustin Arnold is a 68 y.o. male with history of CAD, hypertension and hyperlipidemia.  Today patient is with wife, presenting for fatigue, general malaise, loss of appetite, headaches, chills, nausea, body aches since yesterday.  Denies recorded fever, cough, congestion, sore throat, chest pain, wheezing, shortness of breath, abdominal pain, diarrhea or vomiting.  Denies any sick contacts.  Has not been taking any OTC meds.  HPI  Past Medical History:  Diagnosis Date   Coronary artery disease    Hyperlipemia    Hypertension     Patient Active Problem List   Diagnosis Date Noted   Colon polyp 01/19/2019   Prostate cancer screening 11/30/2014   H/O coronary artery bypass surgery 11/06/2013   Presence of aortocoronary bypass graft 11/06/2013   Arteriosclerosis of autologous vein coronary artery bypass graft 10/14/2013   Benign essential HTN 10/14/2013   HLD (hyperlipidemia) 10/14/2013    Past Surgical History:  Procedure Laterality Date   CARDIAC SURGERY     bypass   CHOLECYSTECTOMY     COLONOSCOPY  2006   cleared for [facility]   COLONOSCOPY WITH PROPOFOL N/A 01/07/2015   Procedure: COLONOSCOPY WITH PROPOFOL;  Surgeon: Wallace Cullens, MD;  Location: [facility] ENDOSCOPY;  Service: Gastroenterology;  Laterality: N/A;   CORONARY ARTERY BYPASS GRAFT         Home Medications    Prior to Admission medications   Medication Sig Start Date End Date Taking? Authorizing Provider  aspirin 81 MG EC tablet Take 1 tablet by mouth daily.     [provider]  atorvastatin (LIPITOR) 80 MG tablet Take 1 tablet by mouth daily. 11/05/14   [provider]  loratadine (CLARITIN) 10 MG tablet Take 10 mg by mouth daily. otc    [provider]  metoprolol succinate (TOPROL-XL) 50 MG 24 hr tablet Take 1 tablet by  mouth daily. 08/25/14   [provider]  Multiple Vitamin (MULTIVITAMIN) capsule Take 1 capsule by mouth daily.    [provider]    Family History Family History  Problem Relation Age of Onset   Cancer Father        esophageal    Social History Social History   Tobacco Use   Smoking status: Never   Smokeless tobacco: Never  Vaping Use   Vaping status: Never Used  Substance Use Topics   Alcohol use: Yes   Drug use: No     Allergies   Other   Review of Systems Review of Systems  Constitutional:  Positive for appetite change, fatigue and fever.  HENT:  Negative for congestion, rhinorrhea, sinus pressure, sinus pain and sore throat.   Respiratory:  Negative for cough and shortness of breath.   Cardiovascular:  Negative for chest pain.  Gastrointestinal:  Positive for nausea. Negative for abdominal pain, diarrhea and vomiting.  Genitourinary:  Negative for difficulty urinating, dysuria, frequency and urgency.  Musculoskeletal:  Positive for myalgias.  Neurological:  Positive for headaches. Negative for weakness and light-headedness.  Hematological:  Negative for adenopathy.     Physical Exam Triage Vital Signs ED Triage Vitals  Encounter Vitals Group     BP 04/14/23 0834 (!) 94/49     Systolic BP Percentile --      Diastolic BP Percentile --  Pulse Rate 04/14/23 0834 90     Resp 04/14/23 0834 15     Temp 04/14/23 0834 98.6 F (37 C)     Temp Source 04/14/23 0834 Oral     SpO2 04/14/23 0834 98 %     Weight 04/14/23 0831 233 lb (105.7 kg)     Height 04/14/23 0831 6' (1.829 m)     Head Circumference --      Peak Flow --      Pain Score 04/14/23 0831 0     Pain Loc --      Pain Education --      Exclude from Growth Chart --    No data found.  Updated Vital Signs BP 98/61 (BP Location: Right Arm)   Pulse 90   Temp 98.6 F (37 C) (Oral)   Resp 15   Ht 6' (1.829 m)   Wt 233 lb (105.7 kg)   SpO2 98%   BMI 31.60 kg/m        Physical Exam Vitals and nursing note reviewed.  Constitutional:      General: He is not in acute distress.    Appearance: Normal appearance. He is well-developed. He is ill-appearing.     Comments: Appears fatigued.  HENT:     Head: Normocephalic and atraumatic.     Nose: Nose normal.     Mouth/Throat:     Mouth: Mucous membranes are moist.     Pharynx: Oropharynx is clear.  Eyes:     General: No scleral icterus.    Conjunctiva/sclera: Conjunctivae normal.  Cardiovascular:     Rate and Rhythm: Normal rate and regular rhythm.  Pulmonary:     Effort: Pulmonary effort is normal. No respiratory distress.     Breath sounds: Normal breath sounds.  Abdominal:     Palpations: Abdomen is soft.     Tenderness: There is no abdominal tenderness.  Musculoskeletal:     Cervical back: Neck supple.  Skin:    General: Skin is warm and dry.     Capillary Refill: Capillary refill takes less than 2 seconds.  Neurological:     General: No focal deficit present.     Mental Status: He is alert. Mental status is at baseline.     Motor: No weakness.     Gait: Gait normal.  Psychiatric:        Mood and Affect: Mood normal.        Behavior: Behavior normal.      UC Treatments / Results  Labs (all labs ordered are listed, but only abnormal results are displayed) Labs Reviewed  URINALYSIS, W/ REFLEX TO CULTURE (INFECTION SUSPECTED) - Abnormal; Notable for the following components:      Result Value   Color, Urine AMBER (*)    Bilirubin Urine SMALL (*)    Ketones, ur 15 (*)    Protein, ur 30 (*)    Bacteria, UA RARE (*)    All other components within normal limits  COMPREHENSIVE METABOLIC PANEL - Abnormal; Notable for the following components:   Sodium 134 (*)    Glucose, Bld 107 (*)    BUN 24 (*)    Creatinine, Ser 1.29 (*)    Calcium 8.4 (*)    AST 50 (*)    ALT 55 (*)    Total Bilirubin 1.4 (*)    All other components within normal limits  CBC WITH DIFFERENTIAL/PLATELET -  Abnormal; Notable for the following components:   Lymphs Abs 0.6 (*)  All other components within normal limits  RESP PANEL BY RT-PCR (FLU A&B, COVID) [id number]    EKG   Radiology DG Chest 2 View Result Date: 04/14/2023 CLINICAL DATA:  Nausea EXAM: CHEST - 2 VIEW COMPARISON:  03/17/2012 FINDINGS: The heart size and mediastinal contours are within normal limits. Prior sternotomy and CABG. Both lungs are clear. The visualized skeletal structures are unremarkable. IMPRESSION: No active cardiopulmonary disease. Electronically Signed   By: Duanne Guess D.O.   On: 04/14/2023 10:11    Procedures Procedures (including critical care time)  Medications Ordered in UC Medications - No data to display  Initial Impression / Assessment and Plan / UC Course  I have reviewed the triage vital signs and the nursing notes.  Pertinent labs & imaging results that were available during my care of the patient were reviewed by me and considered in my medical decision making (see chart for details).   68 year old male presents for vague symptoms of fatigue, general malaise, headaches, nausea and bodyaches since yesterday.  Denies fever, URI symptoms, urinary symptoms, abdominal pain, vomiting or diarrhea.  No sick contacts.  Vitals are all stable and normal.  He is ill and fatigued.  Nontoxic.  Normal HEENT exam.  Chest clear to auscultation heart regular rate and rhythm.  Abdomen soft and nontender.  Patient declines nausea medicine.  Respiratory panel negative for COVID, flu and RSV.  Obtained urinalysis, CBC, CMP and chest x-ray to assess for any other potential infections causing his symptoms since they are vague.  Urinalysis not consistent with UTI but does show evidence of mild dehydration and the fact that he is not eating since there are ketones.   CBC with normal WBC count. No anemia. CMP shows elevated Cr of 1.29 which is up from 1.03 three months ago. Transaminases are slightly elevated but  no other significant findings.   I reviewed all results with patient.  Suspect possible norovirus/viral gastroenteritis.  Explained that he may develop some vomiting and diarrhea.  In that case I advised supportive care.  He again declines any prescription medications.  Encouraged him to increase fluid intake.  He is at least mildly dehydrated and I think that is why his creatinine is a bit elevated.  Tylenol, Pepto-Bismol, Emetrol as needed.  Bland diet and small meals advised.  Advised to continue all home medications.  Thoroughly reviewed return and ED precautions for any acute, worsening or severe symptoms.   Final Clinical Impressions(s) / UC Diagnoses   Final diagnoses:  Other fatigue  Viral illness  Myalgia  Nausea without vomiting     Discharge Instructions      -Negative COVID, flu and RSV. - No urinary tract infection. - Chest x-ray was negative for pneumonia. - Your lab work all looks pretty good other than the fact that you are a little dehydrated. - I think you may have a norovirus or some other stomach virus.  Increase your fluid intake and rest.  May take over-the-counter Emetrol, Pepto-Bismol, Tylenol.  Have smaller meals and follow a bland diet. - If you develop a fever or any new symptoms that you did not have today please consider reevaluation. - If breathing difficulty, abdominal pain, chest pain, increased weakness please seek immediate reevaluation either in urgent care or ER.     ED Prescriptions   None    PDMP not reviewed this encounter.   Shirlee Latch, PA-C 04/14/23 1039

## 2023-04-14 NOTE — Discharge Instructions (Signed)
-  Negative COVID, flu and RSV. - No urinary tract infection. - Chest x-ray was negative for pneumonia. - Your lab work all looks pretty good other than the fact that you are a little dehydrated. - I think you may have a norovirus or some other stomach virus.  Increase your fluid intake and rest.  May take over-the-counter Emetrol, Pepto-Bismol, Tylenol.  Have smaller meals and follow a bland diet. - If you develop a fever or any new symptoms that you did not have today please consider reevaluation. - If breathing difficulty, abdominal pain, chest pain, increased weakness please seek immediate reevaluation either in urgent care or ER.

## 2023-04-15 ENCOUNTER — Ambulatory Visit: Payer: Medicare Other

## 2023-06-12 DIAGNOSIS — K080 Exfoliation of teeth due to systemic causes: Secondary | ICD-10-CM | POA: Diagnosis not present

## 2023-07-24 DIAGNOSIS — I10 Essential (primary) hypertension: Secondary | ICD-10-CM | POA: Diagnosis not present

## 2023-07-24 DIAGNOSIS — H2513 Age-related nuclear cataract, bilateral: Secondary | ICD-10-CM | POA: Diagnosis not present

## 2023-08-28 ENCOUNTER — Ambulatory Visit: Payer: Medicare Other

## 2023-09-05 ENCOUNTER — Ambulatory Visit: Admitting: Emergency Medicine

## 2023-09-05 VITALS — Ht 73.0 in | Wt 230.0 lb

## 2023-09-05 DIAGNOSIS — Z0000 Encounter for general adult medical examination without abnormal findings: Secondary | ICD-10-CM

## 2023-09-05 NOTE — Progress Notes (Signed)
 Subjective:   Dustin Arnold is a 68 y.o. who presents for a Medicare Wellness preventive visit.  As a reminder, Annual Wellness Visits don't include a physical exam, and some assessments may be limited, especially if this visit is performed virtually. We may recommend an in-person follow-up visit with your provider if needed.  Visit Complete: Virtual I connected with  Linton Stolp Eiben on 09/05/23 by a audio enabled telemedicine application and verified that I am speaking with the correct person using two identifiers.  Patient Location: Home  Provider Location: Home Office  I discussed the limitations of evaluation and management by telemedicine. The patient expressed understanding and agreed to proceed.  Vital Signs: Because this visit was a virtual/telehealth visit, some criteria may be missing or patient reported. Any vitals not documented were not able to be obtained and vitals that have been documented are patient reported.  VideoDeclined- This patient declined Librarian, academic. Therefore the visit was completed with audio only.  Persons Participating in Visit: Patient.  AWV Questionnaire: Yes: Patient Medicare AWV questionnaire was completed by the patient on 09/04/23; I have confirmed that all information answered by patient is correct and no changes since this date.  Cardiac Risk Factors include: advanced age (>42men, >69 women);male gender;dyslipidemia;hypertension;obesity (BMI >30kg/m2);Other (see comment), Risk factor comments: CAD     Objective:    Today's Vitals   09/05/23 1501  Weight: 230 lb (104.3 kg)  Height: 6' 1 (1.854 m)   Body mass index is 30.34 kg/m.     09/05/2023    3:08 PM 04/14/2023    8:32 AM 08/22/2022    1:05 PM 08/16/2021    8:27 AM 08/01/2020    9:35 AM 05/18/2017    9:27 AM 01/07/2015    8:58 AM  Advanced Directives  Does Patient Have a Medical Advance Directive? Yes Yes No Yes Yes Yes  Yes   Type of Special educational needs teacher of [place];Living will Healthcare Power of [place];Living will  Healthcare Power of [place];Living will Healthcare Power of [place];Living will Living will   Does patient want to make changes to medical advance directive? No - Patient declined        Copy of Healthcare Power of Attorney in Chart? No - copy requested   No - copy requested No - copy requested    Would patient like information on creating a medical advance directive?   No - Patient declined         Data saved with a previous flowsheet row definition    Current Medications (verified) Outpatient Encounter Medications as of 09/05/2023  Medication Sig   aspirin 81 MG EC tablet Take 1 tablet by mouth daily.    atorvastatin (LIPITOR) 80 MG tablet Take 1 tablet by mouth daily.   loratadine (CLARITIN) 10 MG tablet Take 10 mg by mouth daily. otc   metoprolol  succinate (TOPROL -XL) 50 MG 24 hr tablet Take 1 tablet by mouth daily.   Multiple Vitamin (MULTIVITAMIN) capsule Take 1 capsule by mouth daily.   No facility-administered encounter medications on file as of 09/05/2023.    Allergies (verified) Other   History: Past Medical History:  Diagnosis Date   Coronary artery disease    Hyperlipemia    Hypertension    Past Surgical History:  Procedure Laterality Date   CARDIAC SURGERY     bypass   CHOLECYSTECTOMY     COLONOSCOPY  2006   cleared for [facility]   COLONOSCOPY WITH PROPOFOL  N/A  01/07/2015   Procedure: COLONOSCOPY WITH PROPOFOL ;  Surgeon: Deward CINDERELLA Piedmont, MD;  Location: [facility] ENDOSCOPY;  Service: Gastroenterology;  Laterality: N/A;   CORONARY ARTERY BYPASS GRAFT     Family History  Problem Relation Age of Onset   Hypertension Mother    Cancer Father        esophageal   Social History   Socioeconomic History   Marital status: Married    Spouse name: Dwayne   Number of children: 1   Years of education: Not on file   Highest education level: Master's degree (e.g., MA, MS, MEng, MEd, MSW, MBA)   Occupational History   Occupation: retired  Tobacco Use   Smoking status: Never   Smokeless tobacco: Never  Vaping Use   Vaping status: Never Used  Substance and Sexual Activity   Alcohol use: Yes    Comment: 1-2 drinks once per week   Drug use: No   Sexual activity: Yes  Other Topics Concern   Not on file  Social History Narrative   1 biological child and 3 step-children   Social Drivers of Corporate investment banker Strain: Low Risk  (09/05/2023)   Overall Financial Resource Strain (CARDIA)    Difficulty of Paying Living Expenses: Not hard at all  Food Insecurity: No Food Insecurity (09/05/2023)   Hunger Vital Sign    Worried About Running Out of Food in the Last Year: Never true    Ran Out of Food in the Last Year: Never true  Transportation Needs: No Transportation Needs (09/05/2023)   PRAPARE - Administrator, Civil Service (Medical): No    Lack of Transportation (Non-Medical): No  Physical Activity: Sufficiently Active (09/05/2023)   Exercise Vital Sign    Days of Exercise per Week: 3 days    Minutes of Exercise per Session: 120 min  Stress: No Stress Concern Present (09/05/2023)   [employer] of Occupational Health - Occupational Stress Questionnaire    Feeling of Stress: Not at all  Social Connections: Socially Integrated (09/05/2023)   Social Connection and Isolation Panel    Frequency of Communication with Friends and Family: Twice a week    Frequency of Social Gatherings with Friends and Family: Twice a week    Attends Religious Services: More than 4 times per year    Active Member of [employer] or Organizations: Yes    Attends Engineer, structural: More than 4 times per year    Marital Status: Married    Tobacco Counseling Counseling given: Not Answered    Clinical Intake:  Pre-visit preparation completed: Yes  Pain : No/denies pain     BMI - recorded: 30.34 Nutritional Status: BMI > 30  Obese Nutritional Risks:  None Diabetes: No  No results found for: HGBA1C   How often do you need to have someone help you when you read instructions, pamphlets, or other written materials from your doctor or pharmacy?: 1 - Never  Interpreter Needed?: No  Information entered by :: Vina Ned, CMA   Activities of Daily Living     09/05/2023    3:02 PM 09/04/2023   12:39 PM  In your present state of health, do you have any difficulty performing the following activities:  Hearing? 0 0  Vision? 0 0  Difficulty concentrating or making decisions? 0 0  Walking or climbing stairs? 0 0  Dressing or bathing? 0 0  Doing errands, shopping? 0 0  Preparing Food and eating ? N N  Using the Toilet? N N  In the past six months, have you accidently leaked urine? N N  Do you have problems with loss of bowel control? N N  Managing your Medications? N N  Managing your Finances? N N  Housekeeping or managing your Housekeeping? N N    Patient Care Team: Kotturi, Vinay K, MD as PCP - General (Family Medicine) Ammon Blunt, MD as Consulting Physician (Cardiology) Trevor Haskell, [place] (Optometry)  I have updated your Care Teams any recent Medical Services you may have received from other providers in the past year.     Assessment:   This is a routine wellness examination for [facility].  Hearing/Vision screen Hearing Screening - Comments:: Denies hearing loss Vision Screening - Comments:: Gets routine eye exams, Mevelyn Slack, [place]   Goals Addressed               This Visit's Progress     Weight (lb) < 220 lb (99.8 kg) (pt-stated)   230 lb (104.3 kg)      Depression Screen     09/05/2023    3:06 PM 01/07/2023    9:19 AM 08/22/2022    1:04 PM 01/03/2022    8:38 AM 08/16/2021    8:26 AM 12/30/2020   10:01 AM 08/01/2020    9:32 AM  PHQ 2/9 Scores  PHQ - 2 Score 0 0 0 0 0 0 0  PHQ- 9 Score 0 0 0 0  0     Fall Risk     09/05/2023    3:09 PM 09/04/2023   12:39 PM 01/07/2023    9:20 AM 08/22/2022     1:06 PM 08/21/2022    6:24 PM  Fall Risk   Falls in the past year? 0 0 1 1   Comment   pt was hiking    Number falls in past yr: 0  0 0 0  Injury with Fall? 0  1 1 1   Risk for fall due to : No Fall Risks  No Fall Risks History of fall(s)   Follow up Falls evaluation completed  Falls evaluation completed Falls evaluation completed;Falls prevention discussed     MEDICARE RISK AT HOME:  Medicare Risk at Home Any stairs in or around the home?: Yes If so, are there any without handrails?: No Home free of loose throw rugs in walkways, pet beds, electrical cords, etc?: Yes Adequate lighting in your home to reduce risk of falls?: Yes Life alert?: No Use of a cane, walker or w/c?: No Grab bars in the bathroom?: No Shower chair or bench in shower?: No Elevated toilet seat or a handicapped toilet?: No  TIMED UP AND GO:  Was the test performed?  No  Cognitive Function: 6CIT completed        09/05/2023    3:10 PM 08/22/2022    1:08 PM  6CIT Screen  What Year? 0 points 0 points  What month? 0 points 0 points  What time? 0 points 0 points  Count back from 20 0 points 0 points  Months in reverse 0 points 0 points  Repeat phrase 0 points 0 points  Total Score 0 points 0 points    Immunizations Immunization History  Administered Date(s) Administered   Fluad Quad(high Dose 65+) 12/17/2019, 12/30/2020, 01/03/2022   Fluad Trivalent(High Dose 65+) 01/07/2023   Influenza,inj,Quad PF,6+ Mos 11/30/2014, 11/27/2017, 12/11/2018   PFIZER(Purple Top)SARS-COV-2 Vaccination 06/09/2019, 06/23/2019, 12/25/2019   PNEUMOCOCCAL CONJUGATE-20 01/03/2022   Pneumococcal Conjugate-13 12/17/2019  Pneumococcal Polysaccharide-23 11/30/2014   Tdap 11/27/2017   Vaxelis (DTaP,IPV,Hib,HepB) 01/27/2020    Screening Tests Health Maintenance  Topic Date Due   Zoster Vaccines- Shingrix (1 of 2) Never done   Hepatitis B Vaccines (2 of 3 - 19+ 3-dose series) 02/24/2020   COVID-19 Vaccine (4 - 2024-25 season)  11/11/2022   INFLUENZA VACCINE  10/11/2023   Medicare Annual Wellness (AWV)  09/04/2024   Colonoscopy  10/14/2025   DTaP/Tdap/Td (3 - Td or Tdap) 01/26/2030   Pneumococcal Vaccine: 50+ Years  Completed   Hepatitis C Screening  Completed   HPV VACCINES  Aged Out   Meningococcal B Vaccine  Aged Out    Health Maintenance  Health Maintenance Due  Topic Date Due   Zoster Vaccines- Shingrix (1 of 2) Never done   Hepatitis B Vaccines (2 of 3 - 19+ 3-dose series) 02/24/2020   COVID-19 Vaccine (4 - 2024-25 season) 11/11/2022   Health Maintenance Items Addressed: See Nurse Notes at the end of this note  Additional Screening:  Vision Screening: Recommended annual ophthalmology exams for early detection of glaucoma and other disorders of the eye. Would you like a referral to an eye doctor? No    Dental Screening: Recommended annual dental exams for proper oral hygiene  Community Resource Referral / Chronic Care Management: CRR required this visit?  No   CCM required this visit?  No   Plan:    I have personally reviewed and noted the following in the patient's chart:   Medical and social history Use of alcohol, tobacco or illicit drugs  Current medications and supplements including opioid prescriptions. Patient is not currently taking opioid prescriptions. Functional ability and status Nutritional status Physical activity Advanced directives List of other physicians Hospitalizations, surgeries, and ER visits in previous 12 months Vitals Screenings to include cognitive, depression, and falls Referrals and appointments  In addition, I have reviewed and discussed with patient certain preventive protocols, quality metrics, and best practice recommendations. A written personalized care plan for preventive services as well as general preventive health recommendations were provided to patient.   Vina Ned, CMA   09/05/2023   After Visit Summary: (MyChart) Due to this being a  telephonic visit, the after visit summary with patients personalized plan was offered to patient via MyChart   Notes:  Shingrix UTD per patient Declined covid vaccine

## 2023-09-05 NOTE — Patient Instructions (Signed)
 [name] , Thank you for taking time out of your busy schedule to complete your Annual Wellness Visit with me. I enjoyed our conversation and look forward to speaking with you again next year. I, as well as your care team,  appreciate your ongoing commitment to your health goals. Please review the following plan we discussed and let me know if I can assist you in the future. Your Game plan/ To Do List    Referrals: None  Follow up Visits: Next Medicare AWV with our clinical staff: 09/17/24 @ 3:10pm (VIDEO VISIT)   Have you seen your provider in the last 6 months (3 months if uncontrolled diabetes)? No Next Office Visit with your provider: 01/15/24 @ 8:00am with Dr. Mackey Ny  Clinician Recommendations:  Aim for 30 minutes of exercise or brisk walking, 6-8 glasses of water, and 5 servings of fruits and vegetables each day.       This is a list of the screening recommended for you and due dates:  Health Maintenance  Topic Date Due   Zoster (Shingles) Vaccine (1 of 2) Never done   Hepatitis B Vaccine (2 of 3 - 19+ 3-dose series) 02/24/2020   COVID-19 Vaccine (4 - 2024-25 season) 11/11/2022   Flu Shot  10/11/2023   Medicare Annual Wellness Visit  09/04/2024   Colon Cancer Screening  10/14/2025   DTaP/Tdap/Td vaccine (3 - Td or Tdap) 01/26/2030   Pneumococcal Vaccine for age over 35  Completed   Hepatitis C Screening  Completed   HPV Vaccine  Aged Out   Meningitis B Vaccine  Aged Out    Advanced directives: (Copy Requested) Please bring a copy of your health care power of attorney and living will to the office to be added to your chart at your convenience. You can mail to [facility] [address] [place], [place] [address] or email to [email or web address] Advance Care Planning is important because it:  [x]  Makes sure you receive the medical care that is consistent with your values, goals, and preferences  [x]  It provides guidance to your family and loved ones  and reduces their decisional burden about whether or not they are making the right decisions based on your wishes.  Follow the link provided in your after visit summary or read over the paperwork we have mailed to you to help you started getting your Advance Directives in place. If you need assistance in completing these, please reach out to us  so that we can help you!  See attachments for Preventive Care and Fall Prevention Tips.  Fall Prevention in the Home, Adult Falls can cause injuries and affect people of all ages. There are many simple things that you can do to make your home safe and to help prevent falls. If you need it, ask for help making these changes. What actions can I take to prevent falls? General information Use good lighting in all rooms. Make sure to: Replace any light bulbs that burn out. Turn on lights if it is dark and use night-lights. Keep items that you use often in easy-to-reach places. Lower the shelves around your home if needed. Move furniture so that there are clear paths around it. Do not keep throw rugs or other things on the floor that can make you trip. If any of your floors are uneven, fix them. Add color or contrast paint or tape to clearly mark and help you see: Grab bars or handrails. First and last steps of staircases.  Where the edge of each step is. If you use a ladder or stepladder: Make sure that it is fully opened. Do not climb a closed ladder. Make sure the sides of the ladder are locked in place. Have someone hold the ladder while you use it. Know where your pets are as you move through your home. What can I do in the bathroom?     Keep the floor dry. Clean up any water that is on the floor right away. Remove soap buildup in the bathtub or shower. Buildup makes bathtubs and showers slippery. Use non-skid mats or decals on the floor of the bathtub or shower. Attach bath mats securely with double-sided, non-slip rug tape. If you need to  sit down while you are in the shower, use a non-slip stool. Install grab bars by the toilet and in the bathtub and shower. Do not use towel bars as grab bars. What can I do in the bedroom? Make sure that you have a light by your bed that is easy to reach. Do not use any sheets or blankets on your bed that hang to the floor. Have a firm bench or chair with side arms that you can use for support when you get dressed. What can I do in the kitchen? Clean up any spills right away. If you need to reach something above you, use a sturdy step stool that has a grab bar. Keep electrical cables out of the way. Do not use floor polish or wax that makes floors slippery. What can I do with my stairs? Do not leave anything on the stairs. Make sure that you have a light switch at the top and the bottom of the stairs. Have them installed if you do not have them. Make sure that there are handrails on both sides of the stairs. Fix handrails that are broken or loose. Make sure that handrails are as long as the staircases. Install non-slip stair treads on all stairs in your home if they do not have carpet. Avoid having throw rugs at the top or bottom of stairs, or secure the rugs with carpet tape to prevent them from moving. Choose a carpet design that does not hide the edge of steps on the stairs. Make sure that carpet is firmly attached to the stairs. Fix any carpet that is loose or worn. What can I do on the outside of my home? Use bright outdoor lighting. Repair the edges of walkways and driveways and fix any cracks. Clear paths of anything that can make you trip, such as tools or rocks. Add color or contrast paint or tape to clearly mark and help you see high doorway thresholds. Trim any bushes or trees on the main path into your home. Check that handrails are securely fastened and in good repair. Both sides of all steps should have handrails. Install guardrails along the edges of any raised decks or  porches. Have leaves, snow, and ice cleared regularly. Use sand, salt, or ice melt on walkways during winter months if you live where there is ice and snow. In the garage, clean up any spills right away, including grease or oil spills. What other actions can I take? Review your medicines with your health care provider. Some medicines can make you confused or feel dizzy. This can increase your chance of falling. Wear closed-toe shoes that fit well and support your feet. Wear shoes that have rubber soles and low heels. Use a cane, walker, scooter, or crutches that help  you move around if needed. Talk with your provider about other ways that you can decrease your risk of falls. This may include seeing a physical therapist to learn to do exercises to improve movement and strength. Where to find more information Centers for Disease Control and Prevention, STEADI: TonerPromos.no [employer] on Aging: BaseRingTones.pl National Institute on Aging: BaseRingTones.pl Contact a health care provider if: You are afraid of falling at home. You feel weak, drowsy, or dizzy at home. You fall at home. Get help right away if you: Lose consciousness or have trouble moving after a fall. Have a fall that causes a head injury. These symptoms may be an emergency. Get help right away. Call 911. Do not wait to see if the symptoms will go away. Do not drive yourself to the hospital. This information is not intended to replace advice given to you by your health care provider. Make sure you discuss any questions you have with your health care provider. Document Revised: 10/30/2021 Document Reviewed: 10/30/2021 Elsevier Patient Education  2024 [employer].

## 2024-01-03 NOTE — Progress Notes (Signed)
 Dustin Arnold                                          MRN: [id number]   01/03/2024   The [facility]BCI Quality Team Specialist reviewed this patient medical record for the purposes of chart review for care gap closure. The following were reviewed: chart review for care gap closure-controlling blood pressure.    VBCI Quality Team

## 2024-01-09 ENCOUNTER — Encounter: Payer: Self-pay | Admitting: Family Medicine

## 2024-01-10 DIAGNOSIS — I25810 Atherosclerosis of coronary artery bypass graft(s) without angina pectoris: Secondary | ICD-10-CM | POA: Diagnosis not present

## 2024-01-10 DIAGNOSIS — Z951 Presence of aortocoronary bypass graft: Secondary | ICD-10-CM | POA: Diagnosis not present

## 2024-01-10 DIAGNOSIS — E7800 Pure hypercholesterolemia, unspecified: Secondary | ICD-10-CM | POA: Diagnosis not present

## 2024-01-10 DIAGNOSIS — I10 Essential (primary) hypertension: Secondary | ICD-10-CM | POA: Diagnosis not present

## 2024-01-15 ENCOUNTER — Ambulatory Visit (INDEPENDENT_AMBULATORY_CARE_PROVIDER_SITE_OTHER): Admitting: Family Medicine

## 2024-01-15 ENCOUNTER — Encounter: Payer: Self-pay | Admitting: Family Medicine

## 2024-01-15 VITALS — BP 118/76 | HR 72 | Ht 73.0 in | Wt 235.0 lb

## 2024-01-15 DIAGNOSIS — Z23 Encounter for immunization: Secondary | ICD-10-CM | POA: Diagnosis not present

## 2024-01-15 DIAGNOSIS — E782 Mixed hyperlipidemia: Secondary | ICD-10-CM | POA: Diagnosis not present

## 2024-01-15 DIAGNOSIS — Z131 Encounter for screening for diabetes mellitus: Secondary | ICD-10-CM | POA: Diagnosis not present

## 2024-01-15 DIAGNOSIS — Z130 Encounter for screening for diseases of the blood and blood-forming organs and certain disorders involving the immune mechanism: Secondary | ICD-10-CM

## 2024-01-15 DIAGNOSIS — Z13220 Encounter for screening for lipoid disorders: Secondary | ICD-10-CM | POA: Diagnosis not present

## 2024-01-15 DIAGNOSIS — Z0000 Encounter for general adult medical examination without abnormal findings: Secondary | ICD-10-CM | POA: Diagnosis not present

## 2024-01-15 DIAGNOSIS — I10 Essential (primary) hypertension: Secondary | ICD-10-CM | POA: Diagnosis not present

## 2024-01-15 DIAGNOSIS — Z951 Presence of aortocoronary bypass graft: Secondary | ICD-10-CM

## 2024-01-15 DIAGNOSIS — Z136 Encounter for screening for cardiovascular disorders: Secondary | ICD-10-CM

## 2024-01-15 DIAGNOSIS — Z8042 Family history of malignant neoplasm of prostate: Secondary | ICD-10-CM

## 2024-01-15 NOTE — Patient Instructions (Signed)
   We hope you enjoyed your visit with our office! Your feedback means so much to our team, and it helps us  to continue providing the best care possible. If you had a positive experience, we'd love if you could share it by leaving us  a [employer] Review and also completing our patient survey that you'll receive soon.  Your kind words not only brighten our day but also help other patients feel confident in choosing our office for their care.  Thank you for being a part of our practice family!   Dr. [facility]  Primary Care & Sports Medicine [facility] [address]  [place] [address] Office [phone]  Fax: [phone]'

## 2024-01-15 NOTE — Progress Notes (Signed)
 Complete physical exam  Patient: Dustin Arnold    DOB: December 10, 1955 68 y.o.   MRN: [id number]  No chief complaint on file.   Subjective:    Dustin Arnold is a 68 y.o. male who presents today for a complete physical exam. He reports consuming a general diet. Home exercise routine includes hiking. He generally feels fairly well. He reports sleeping fairly well. He does not have additional problems to discuss today.   Discussed the use of AI scribe software for clinical note transcription with the patient, who gave verbal consent to proceed.  History of Present Illness Dustin Arnold is a 68 year old male who presents for an annual physical exam.  He has a history of coronary artery bypass surgery performed 30 years ago due to unusual fatigue and a positive stress test. Since then, he has not experienced further blockages and is regularly monitored by cardiology. No current chest pain, shortness of breath, or other cardiac symptoms. He remains active, leading a hiking group and hiking two to three times a week.  He has a history of hypertension, managed with medication for several years, and his blood pressure is well-controlled. No family history of heart problems.  There is a family history of prostate cancer in his younger brother, diagnosed 15 years ago. His father did not have prostate cancer. His prostate levels were normal last year. No urinary symptoms such as hematuria, weak stream, or difficulty urinating, although he gets up once a night to urinate, which he considers normal.  He reports a bothersome left knee, evaluated four years ago and found to have a worn meniscus, affecting his ability to get up from the ground. He also has a hammer toe that developed a few years ago, but it is not painful.  No current skin problems, but he had a non-cancerous mole removed from his back in the past. He is trying to reconnect with a dermatologist after being dropped by his previous one.  No  gastrointestinal symptoms such as blood in the stool, constipation, or diarrhea. No recent falls or problems with walking.   Most recent fall risk assessment:    01/15/2024    7:57 AM  Fall Risk   Falls in the past year? 0  Number falls in past yr: 0  Injury with Fall? 0  Risk for fall due to : No Fall Risks  Follow up Falls evaluation completed     Most recent depression screenings:    01/15/2024    7:57 AM 09/05/2023    3:06 PM  PHQ 2/9 Scores  PHQ - 2 Score 0 0  PHQ- 9 Score  0    Vision:Within last year and Dental: No current dental problems    Patient Care Team: Xoe Hoe K, MD as PCP - General (Family Medicine) Ammon Blunt, MD as Consulting Physician (Cardiology) [place], Fleeta, OD (Optometry)   Review of Systems  All other systems reviewed and are negative.     Objective:    BP 118/76   Pulse 72   Ht 6' 1 (1.854 m)   Wt 235 lb (106.6 kg)   SpO2 97%   BMI 31.00 kg/m     Physical Exam Vitals and nursing note reviewed.  Constitutional:      Appearance: Normal appearance.  HENT:     Head: Normocephalic.     Right Ear: External ear normal.     Left Ear: External ear normal.  Eyes:     Conjunctiva/sclera:  Conjunctivae normal.  Cardiovascular:     Rate and Rhythm: Normal rate.  Pulmonary:     Effort: Pulmonary effort is normal. No respiratory distress.  Abdominal:     Palpations: Abdomen is soft.  Musculoskeletal:        General: Normal range of motion.  Skin:    General: Skin is warm.  Neurological:     Mental Status: He is alert and oriented to person, place, and time.  Psychiatric:        Mood and Affect: Mood normal.     Physical Exam VITALS: BP- 118/76 SKIN: Dry skin    No results found for any visits on 01/15/24.       Assessment & Plan:    Routine Health Maintenance and Physical Exam Immunization History  Administered Date(s) Administered   Fluad Quad(high Dose 65+) 12/17/2019, 12/30/2020, 01/03/2022   Fluad  Trivalent(High Dose 65+) 01/07/2023   Influenza,inj,Quad PF,6+ Mos 11/30/2014, 11/27/2017, 12/11/2018   PFIZER(Purple Top)SARS-COV-2 Vaccination 06/09/2019, 06/23/2019, 12/25/2019   PNEUMOCOCCAL CONJUGATE-20 01/03/2022   Pneumococcal Conjugate-13 12/17/2019   Pneumococcal Polysaccharide-23 11/30/2014   Tdap 11/27/2017   Vaxelis (DTaP,IPV,Hib,HepB) 01/27/2020    Health Maintenance  Topic Date Due   Zoster Vaccines- Shingrix (1 of 2) Never done   Influenza Vaccine  10/11/2023   COVID-19 Vaccine (4 - 2025-26 season) 01/30/2025 (Originally 11/11/2023)   Medicare Annual Wellness (AWV)  09/04/2024   Colonoscopy  10/14/2025   DTaP/Tdap/Td (3 - Td or Tdap) 01/26/2030   Pneumococcal Vaccine: 50+ Years  Completed   Hepatitis C Screening  Completed   Meningococcal B Vaccine  Aged Out   Hepatitis B Vaccines 19-59 Average Risk  Discontinued    Discussed health benefits of physical activity, and encouraged him to engage in regular exercise appropriate for his age and condition.  Problem List Items Addressed This Visit       Other   HLD (hyperlipidemia)   Other Visit Diagnoses       Essential hypertension    -  Primary   Relevant Orders   Comprehensive Metabolic Panel (CMET)     Hx of CABG         Family history of prostate cancer       Relevant Orders   PSA     Diabetes mellitus screening       Relevant Orders   Hemoglobin A1c     Annual physical exam         Screening for iron deficiency anemia       Relevant Orders   CBC with Differential     Encounter for lipid screening for cardiovascular disease       Relevant Orders   Lipid panel       Assessment and Plan Assessment & Plan Adult Wellness Visit Routine annual exam with no acute concerns. Blood pressure controlled. Regular physical activity.  - Continue current medications as prescribed by cardiologist. - Encouraged regular physical activity, including hiking and golf. - Administered flu shot today.  Essential  hypertension Hypertension well-controlled with current medication. Blood pressure 118/76 mmHg. - Continue current antihypertensive medication regimen.  Mixed hyperlipidemia Cholesterol levels well-controlled with current treatment. - Ordered cholesterol panel as part of routine blood work.  S/p CABG Status post aortocoronary bypass graft 30 years ago, asymptomatic. No recent cardiac events. - Continue current cardiac medications as prescribed by cardiologist.  Screening for diabetes mellitus No previous A1c test. Screening warranted due to age and family history. - Ordered A1c test as part  of routine blood work.  Screening for prostate cancer due to family history Family history of prostate cancer. Previous PSA levels normal. Screening warranted. - Ordered PSA test as part of routine screening.    No follow-ups on file.    Vinary K Jenasis Straley, MD [facility] at [facility]

## 2024-01-16 ENCOUNTER — Ambulatory Visit: Payer: Self-pay | Admitting: Family Medicine

## 2024-01-16 LAB — CBC WITH DIFFERENTIAL/PLATELET
Basophils Absolute: 0 x10E3/uL (ref 0.0–0.2)
Basos: 1 %
EOS (ABSOLUTE): 0.3 x10E3/uL (ref 0.0–0.4)
Eos: 5 %
Hematocrit: 41.2 % (ref 37.5–51.0)
Hemoglobin: 13.6 g/dL (ref 13.0–17.7)
Immature Grans (Abs): 0 x10E3/uL (ref 0.0–0.1)
Immature Granulocytes: 0 %
Lymphocytes Absolute: 2.1 x10E3/uL (ref 0.7–3.1)
Lymphs: 34 %
MCH: 31.9 pg (ref 26.6–33.0)
MCHC: 33 g/dL (ref 31.5–35.7)
MCV: 97 fL (ref 79–97)
Monocytes Absolute: 0.5 x10E3/uL (ref 0.1–0.9)
Monocytes: 8 %
Neutrophils Absolute: 3.2 x10E3/uL (ref 1.4–7.0)
Neutrophils: 52 %
Platelets: 174 x10E3/uL (ref 150–450)
RBC: 4.26 x10E6/uL (ref 4.14–5.80)
RDW: 13.1 % (ref 11.6–15.4)
WBC: 6 x10E3/uL (ref 3.4–10.8)

## 2024-01-16 LAB — COMPREHENSIVE METABOLIC PANEL WITH GFR
ALT: 41 IU/L (ref 0–44)
AST: 36 IU/L (ref 0–40)
Albumin: 4.2 g/dL (ref 3.9–4.9)
Alkaline Phosphatase: 65 IU/L (ref 47–123)
BUN/Creatinine Ratio: 11 (ref 10–24)
BUN: 12 mg/dL (ref 8–27)
Bilirubin Total: 0.7 mg/dL (ref 0.0–1.2)
CO2: 26 mmol/L (ref 20–29)
Calcium: 9.4 mg/dL (ref 8.6–10.2)
Chloride: 101 mmol/L (ref 96–106)
Creatinine, Ser: 1.12 mg/dL (ref 0.76–1.27)
Globulin, Total: 2.1 g/dL (ref 1.5–4.5)
Glucose: 99 mg/dL (ref 70–99)
Potassium: 4.6 mmol/L (ref 3.5–5.2)
Sodium: 139 mmol/L (ref 134–144)
Total Protein: 6.3 g/dL (ref 6.0–8.5)
eGFR: 71 mL/min/1.73 (ref >59)

## 2024-01-16 LAB — LIPID PANEL
Chol/HDL Ratio: 2.4 ratio (ref 0.0–5.0)
Cholesterol, Total: 127 mg/dL (ref 100–199)
HDL: 52 mg/dL (ref >39)
LDL Chol Calc (NIH): 60 mg/dL (ref 0–99)
Triglycerides: 74 mg/dL (ref 0–149)
VLDL Cholesterol Cal: 15 mg/dL (ref 5–40)

## 2024-01-16 LAB — HEMOGLOBIN A1C
Est. average glucose Bld gHb Est-mCnc: 117 mg/dL
Hgb A1c MFr Bld: 5.7 % — ABNORMAL HIGH (ref 4.8–5.6)

## 2024-01-16 LAB — PSA: Prostate Specific Ag, Serum: 0.5 ng/mL (ref 0.0–4.0)

## 2024-01-20 DIAGNOSIS — D2262 Melanocytic nevi of left upper limb, including shoulder: Secondary | ICD-10-CM | POA: Diagnosis not present

## 2024-01-20 DIAGNOSIS — D225 Melanocytic nevi of trunk: Secondary | ICD-10-CM | POA: Diagnosis not present

## 2024-01-20 DIAGNOSIS — D2261 Melanocytic nevi of right upper limb, including shoulder: Secondary | ICD-10-CM | POA: Diagnosis not present

## 2024-01-20 DIAGNOSIS — L570 Actinic keratosis: Secondary | ICD-10-CM | POA: Diagnosis not present

## 2024-01-20 DIAGNOSIS — D2272 Melanocytic nevi of left lower limb, including hip: Secondary | ICD-10-CM | POA: Diagnosis not present

## 2024-01-27 ENCOUNTER — Ambulatory Visit (INDEPENDENT_AMBULATORY_CARE_PROVIDER_SITE_OTHER)

## 2024-01-27 ENCOUNTER — Ambulatory Visit: Admitting: Podiatry

## 2024-01-27 DIAGNOSIS — M722 Plantar fascial fibromatosis: Secondary | ICD-10-CM

## 2024-01-27 MED ORDER — METHYLPREDNISOLONE 4 MG PO TBPK: ORAL_TABLET | ORAL | 0 refills | Status: AC

## 2024-01-27 MED ORDER — MELOXICAM 15 MG PO TABS
15.0000 mg | ORAL_TABLET | Freq: Every day | ORAL | 3 refills | Status: AC
Start: 2024-01-27 — End: ?

## 2024-01-27 MED ORDER — TRIAMCINOLONE ACETONIDE 40 MG/ML IJ SUSP: 20.0000 mg | Freq: Once | INTRAMUSCULAR | Status: AC

## 2024-01-27 NOTE — Progress Notes (Signed)
 Subjective:  Patient ID: Dustin Arnold, male    DOB: [birth year],  MRN: [id number] HPI Chief Complaint  Patient presents with   Plantar Fasciitis    Right heel pain    68 y.o. male presents with the above complaint.   ROS: Denies fever chills nausea vomit muscle aches pains calf pain back pain chest pain shortness of breath.  Past Medical History:  Diagnosis Date   Coronary artery disease    Hyperlipemia    Hypertension    Past Surgical History:  Procedure Laterality Date   CARDIAC SURGERY     bypass   CHOLECYSTECTOMY     COLONOSCOPY  2006   cleared for [facility]   COLONOSCOPY WITH PROPOFOL  N/A 01/07/2015   Procedure: COLONOSCOPY WITH PROPOFOL ;  Surgeon: Deward CINDERELLA Piedmont, MD;  Location: [facility] ENDOSCOPY;  Service: Gastroenterology;  Laterality: N/A;   CORONARY ARTERY BYPASS GRAFT      Current Outpatient Medications:    meloxicam  (MOBIC ) 15 MG tablet, Take 1 tablet (15 mg total) by mouth daily., Disp: 30 tablet, Rfl: 3   methylPREDNISolone  (MEDROL  DOSEPAK) 4 MG TBPK tablet, 6 day dose pack - take as directed, Disp: 21 tablet, Rfl: 0   aspirin 81 MG EC tablet, Take 1 tablet by mouth daily. , Disp: , Rfl:    atorvastatin (LIPITOR) 80 MG tablet, Take 1 tablet by mouth daily., Disp: , Rfl:    loratadine (CLARITIN) 10 MG tablet, Take 10 mg by mouth daily. otc, Disp: , Rfl:    metoprolol  succinate (TOPROL -XL) 50 MG 24 hr tablet, Take 1 tablet by mouth daily., Disp: , Rfl:    Multiple Vitamin (MULTIVITAMIN) capsule, Take 1 capsule by mouth daily., Disp: , Rfl:   Current Facility-Administered Medications:    triamcinolone  acetonide (KENALOG -40) injection 20 mg, 20 mg, Other, Once,   Allergies  Allergen Reactions   Other Other (See Comments)    IVP dye   Review of Systems Objective:  There were no vitals filed for this visit.  General: Well developed, nourished, in no acute distress, alert and oriented x3   Dermatological: Skin is warm, dry and supple bilateral. Nails x 10 are well  maintained; remaining integument appears unremarkable at this time. There are no open sores, no preulcerative lesions, no rash or signs of infection present.  Vascular: Dorsalis Pedis artery and Posterior Tibial artery pedal pulses are 2/4 bilateral with immedate capillary fill time. Pedal hair growth present. No varicosities and no lower extremity edema present bilateral.   Neruologic: Grossly intact via light touch bilateral. Vibratory intact via tuning fork bilateral. Protective threshold with Semmes Wienstein monofilament intact to all pedal sites bilateral. Patellar and Achilles deep tendon reflexes 2+ bilateral. No Babinski or clonus noted bilateral.   Musculoskeletal: No gross boney pedal deformities bilateral. No pain, crepitus, or limitation noted with foot and ankle range of motion bilateral. Muscular strength 5/5 in all groups tested bilateral.  Pain on palpation medial calcaneal tubercle of the right heel.  No pain on medial-lateral compression of the calcaneus.  Gait: Unassisted, Nonantalgic.    Radiographs:  Radiographs taken today demonstrate osseously mature individual good bone mineralization.  Soft tissue increase in density at the plantar fascial calcaneal insertion site indicative of plantar fasciitis.  No acute findings.  Assessment & Plan:   Assessment: Plantar fasciitis right foot.  Plan: Discussed etiology pathology conservative versus surgical therapies discussed appropriate shoe gear stretching exercises ice therapy and shoe gear modifications.  I injected the right heel today with 20  mg of Kenalog  5 mg of Marcaine to the point of maximal tenderness.  I also started him on methylprednisolone  to be followed by meloxicam .  Dispensed a plantar fascia brace as well.     Addilee Neu T. [place], [place]

## 2024-02-26 ENCOUNTER — Telehealth: Admitting: Physician Assistant

## 2024-02-26 DIAGNOSIS — B9689 Other specified bacterial agents as the cause of diseases classified elsewhere: Secondary | ICD-10-CM

## 2024-02-26 DIAGNOSIS — J208 Acute bronchitis due to other specified organisms: Secondary | ICD-10-CM | POA: Diagnosis not present

## 2024-02-26 MED ORDER — PROMETHAZINE-DM 6.25-15 MG/5ML PO SYRP: 5.0000 mL | ORAL_SOLUTION | Freq: Four times a day (QID) | ORAL | 0 refills | Status: AC | PRN

## 2024-02-26 MED ORDER — AZITHROMYCIN 250 MG PO TABS: ORAL_TABLET | ORAL | 0 refills | Status: AC

## 2024-02-26 NOTE — Patient Instructions (Signed)
 Heath JONETTA Ester, thank you for joining Delon CHRISTELLA Dickinson, PA-C for today's virtual visit.  While this provider is not your primary care provider (PCP), if your PCP is located in our provider database this encounter information will be shared with them immediately following your visit.   A [facility] MyChart account gives you access to today's visit and all your visits, tests, and labs performed at [facility]  click here if you don't have a [facility] MyChart account or go to [email or web address]  Consent: (Patient) Coran Dipaola Hartshorne provided verbal consent for this virtual visit at the beginning of the encounter.  Current Medications:  Current Outpatient Medications:    azithromycin  (ZITHROMAX ) 250 MG tablet, Take 2 tablets on day 1, then 1 tablet daily on days 2 through 5, Disp: 6 tablet, Rfl: 0   promethazine -dextromethorphan (PROMETHAZINE -DM) 6.25-15 MG/5ML syrup, Take 5 mLs by mouth 4 (four) times daily as needed., Disp: 118 mL, Rfl: 0   aspirin 81 MG EC tablet, Take 1 tablet by mouth daily. , Disp: , Rfl:    atorvastatin (LIPITOR) 80 MG tablet, Take 1 tablet by mouth daily., Disp: , Rfl:    loratadine (CLARITIN) 10 MG tablet, Take 10 mg by mouth daily. otc, Disp: , Rfl:    meloxicam  (MOBIC ) 15 MG tablet, Take 1 tablet (15 mg total) by mouth daily., Disp: 30 tablet, Rfl: 3   methylPREDNISolone  (MEDROL  DOSEPAK) 4 MG TBPK tablet, 6 day dose pack - take as directed, Disp: 21 tablet, Rfl: 0   metoprolol  succinate (TOPROL -XL) 50 MG 24 hr tablet, Take 1 tablet by mouth daily., Disp: , Rfl:    Multiple Vitamin (MULTIVITAMIN) capsule, Take 1 capsule by mouth daily., Disp: , Rfl:   Current Facility-Administered Medications:    triamcinolone  acetonide (KENALOG -40) injection 20 mg, 20 mg, Other, Once,    Medications ordered in this encounter:  Meds ordered this encounter  Medications   azithromycin  (ZITHROMAX ) 250 MG tablet    Sig: Take 2 tablets on day 1, then 1 tablet daily  on days 2 through 5    Dispense:  6 tablet    Refill:  0    Supervising Provider:   LAMPTEY, PHILIP O [[id number]]   promethazine -dextromethorphan (PROMETHAZINE -DM) 6.25-15 MG/5ML syrup    Sig: Take 5 mLs by mouth 4 (four) times daily as needed.    Dispense:  118 mL    Refill:  0    Supervising Provider:   BLAISE ALEENE KIDD [[id number]]     *If you need refills on other medications prior to your next appointment, please contact your pharmacy*  Follow-Up: Call back or seek an in-person evaluation if the symptoms worsen or if the condition fails to improve as anticipated.  [facility] [phone]  Other Instructions  Acute Bronchitis, Adult  Acute bronchitis is sudden inflammation of the main airways (bronchi) that come off the windpipe (trachea) in the lungs. The swelling causes the airways to get smaller and make more mucus than normal. This can make it hard to breathe and can cause coughing or noisy breathing (wheezing). Acute bronchitis may last several weeks. The cough may last longer. Allergies, asthma, and exposure to smoke may make the condition worse. What are the causes? This condition can be caused by germs and by substances that irritate the lungs, including: Cold and flu viruses. The most common cause of this condition is the virus that causes the common cold. Bacteria. This is less common. Breathing in substances that irritate  the lungs, including: Smoke from cigarettes and other forms of tobacco. Dust and pollen. Fumes from household cleaning products, gases, or burned fuel. Indoor or outdoor air pollution. What increases the risk? The following factors may make you more likely to develop this condition: A weak body's defense system, also called the immune system. A condition that affects your lungs and breathing, such as asthma. What are the signs or symptoms? Common symptoms of this condition include: Coughing. This may bring up clear, yellow, or green  mucus from your lungs (sputum). Wheezing. Runny or stuffy nose. Having too much mucus in your lungs (chest congestion). Shortness of breath. Aches and pains, including sore throat or chest. How is this diagnosed? This condition is usually diagnosed based on: Your symptoms and medical history. A physical exam. You may also have other tests, including tests to rule out other conditions, such as pneumonia. These tests include: A test of lung function. Test of a mucus sample to look for the presence of bacteria. Tests to check the oxygen level in your blood. Blood tests. Chest X-ray. How is this treated? Most cases of acute bronchitis clear up over time without treatment. Your health care provider may recommend: Drinking more fluids to help thin your mucus so it is easier to cough up. Taking inhaled medicine (inhaler) to improve air flow in and out of your lungs. Using a vaporizer or a humidifier. These are machines that add water to the air to help you breathe better. Taking a medicine that thins mucus and clears congestion (expectorant). Taking a medicine that prevents or stops coughing (cough suppressant). It is not common to take an antibiotic medicine for this condition. Follow these instructions at home:  Take over-the-counter and prescription medicines only as told by your health care provider. Use an inhaler, vaporizer, or humidifier as told by your health care provider. Take two teaspoons (10 mL) of honey at bedtime to lessen coughing at night. Drink enough fluid to keep your urine pale yellow. Do not use any products that contain nicotine  or tobacco. These products include cigarettes, chewing tobacco, and vaping devices, such as e-cigarettes. If you need help quitting, ask your health care provider. Get plenty of rest. Return to your normal activities as told by your health care provider. Ask your health care provider what activities are safe for you. Keep all follow-up visits.  This is important. How is this prevented? To lower your risk of getting this condition again: Wash your hands often with soap and water for at least 20 seconds. If soap and water are not available, use hand sanitizer. Avoid contact with people who have cold symptoms. Try not to touch your mouth, nose, or eyes with your hands. Avoid breathing in smoke or chemical fumes. Breathing smoke or chemical fumes will make your condition worse. Get the flu shot every year. Contact a health care provider if: Your symptoms do not improve after 2 weeks. You have trouble coughing up the mucus. Your cough keeps you awake at night. You have a fever. Get help right away if you: Cough up blood. Feel pain in your chest. Have severe shortness of breath. Faint or keep feeling like you are going to faint. Have a severe headache. Have a fever or chills that get worse. These symptoms may represent a serious problem that is an emergency. Do not wait to see if the symptoms will go away. Get medical help right away. Call your local emergency services (911 in the [place] Do not drive  yourself to the hospital. Summary Acute bronchitis is inflammation of the main airways (bronchi) that come off the windpipe (trachea) in the lungs. The swelling causes the airways to get smaller and make more mucus than normal. Drinking more fluids can help thin your mucus so it is easier to cough up. Take over-the-counter and prescription medicines only as told by your health care provider. Do not use any products that contain nicotine  or tobacco. These products include cigarettes, chewing tobacco, and vaping devices, such as e-cigarettes. If you need help quitting, ask your health care provider. Contact a health care provider if your symptoms do not improve after 2 weeks. This information is not intended to replace advice given to you by your health care provider. Make sure you discuss any questions you have with your health care  provider. Document Revised: 06/08/2021 Document Reviewed: 06/29/2020 Elsevier Patient Education  2024 [employer]   If you have been instructed to have an in-person evaluation today at a local Urgent Care facility, please use the link below. It will take you to a list of all of our available [facility], including address, phone number and hours of operation. Please do not delay care.  [facility]  If you or a family member do not have a primary care provider, use the link below to schedule a visit and establish care. When you choose a [facility] primary care physician or advanced practice provider, you gain a long-term partner in health. Find a Primary Care Provider  Learn more about [facility]'s in-office and virtual care options: [facility] - Get Care Now

## 2024-02-26 NOTE — Progress Notes (Signed)
 Virtual Visit Consent   Dustin Arnold, you are scheduled for a virtual visit with a [facility] provider today. Just as with appointments in the office, your consent must be obtained to participate. Your consent will be active for this visit and any virtual visit you may have with one of our providers in the next 365 days. If you have a MyChart account, a copy of this consent can be sent to you electronically.  As this is a virtual visit, video technology does not allow for your provider to perform a traditional examination. This may limit your provider's ability to fully assess your condition. If your provider identifies any concerns that need to be evaluated in person or the need to arrange testing (such as labs, EKG, etc.), we will make arrangements to do so. Although advances in technology are sophisticated, we cannot ensure that it will always work on either your end or our end. If the connection with a video visit is poor, the visit may have to be switched to a telephone visit. With either a video or telephone visit, we are not always able to ensure that we have a secure connection.  By engaging in this virtual visit, you consent to the provision of healthcare and authorize for your insurance to be billed (if applicable) for the services provided during this visit. Depending on your insurance coverage, you may receive a charge related to this service.  I need to obtain your verbal consent now. Are you willing to proceed with your visit today? Dustin Arnold has provided verbal consent on 02/26/2024 for a virtual visit (video or telephone). Dustin Dustin Dickinson, PA-C  Date: 02/26/2024 3:27 PM   Virtual Visit via Video Note   I, Dustin Arnold, connected with  Dustin Arnold  ([id number], 1955/10/28) on 02/26/2024 at  3:15 PM EST by a video-enabled telemedicine application and verified that I am speaking with the correct person using two identifiers.  Location: Patient: Virtual Visit Location  Patient: Home Provider: Virtual Visit Location Provider: Home Office   I discussed the limitations of evaluation and management by telemedicine and the availability of in person appointments. The patient expressed understanding and agreed to proceed.    History of Present Illness: Dustin Arnold is a 68 y.o. who identifies as a male who was assigned male at birth, and is being seen today for cough and congestion.  HPI: URI  This is a new problem. The current episode started in the past 7 days. The problem has been unchanged. There has been no fever. Associated symptoms include chest pain (when coughing only), congestion, coughing, headaches, a sore throat (scratchy) and wheezing (in the beginning but none now). Pertinent negatives include no diarrhea, ear pain, nausea, plugged ear sensation, rhinorrhea or vomiting. Associated symptoms comments: fatigue. Treatments tried: mucinex . The treatment provided no relief.    Problems:  Patient Active Problem List   Diagnosis Date Noted   Colon polyp 01/19/2019   Prostate cancer screening 11/30/2014   H/O coronary artery bypass surgery 11/06/2013   Presence of aortocoronary bypass graft 11/06/2013   Arteriosclerosis of autologous vein coronary artery bypass graft 10/14/2013   Benign essential HTN 10/14/2013   HLD (hyperlipidemia) 10/14/2013    Allergies: Allergies[1] Medications: Current Medications[2]  Observations/Objective: Patient is well-developed, well-nourished in no acute distress.  Resting comfortably at home.  Head is normocephalic, atraumatic.  No labored breathing.  Speech is clear and coherent with logical content.  Patient is alert and oriented at  baseline.    Assessment and Plan: 1. Acute bacterial bronchitis (Primary) - azithromycin  (ZITHROMAX ) 250 MG tablet; Take 2 tablets on day 1, then 1 tablet daily on days 2 through 5  Dispense: 6 tablet; Refill: 0 - promethazine -dextromethorphan (PROMETHAZINE -DM) 6.25-15 MG/5ML syrup;  Take 5 mLs by mouth 4 (four) times daily as needed.  Dispense: 118 mL; Refill: 0  - Worsening over a week despite OTC medications - Will treat with Z-pack and Promethazine  DM - Can continue Mucinex   - Push fluids.  - Rest.  - Steam and humidifier can help - Seek in person evaluation if worsening or symptoms fail to improve    Follow Up Instructions: I discussed the assessment and treatment plan with the patient. The patient was provided an opportunity to ask questions and all were answered. The patient agreed with the plan and demonstrated an understanding of the instructions.  A copy of instructions were sent to the patient via MyChart unless otherwise noted below.    The patient was advised to call back or seek an in-person evaluation if the symptoms worsen or if the condition fails to improve as anticipated.    Dustin Dustin Dickinson, PA-C     [1]  Allergies Allergen Reactions   Other Other (See Comments)    IVP dye  [2]  Current Outpatient Medications:    azithromycin  (ZITHROMAX ) 250 MG tablet, Take 2 tablets on day 1, then 1 tablet daily on days 2 through 5, Disp: 6 tablet, Rfl: 0   promethazine -dextromethorphan (PROMETHAZINE -DM) 6.25-15 MG/5ML syrup, Take 5 mLs by mouth 4 (four) times daily as needed., Disp: 118 mL, Rfl: 0   aspirin 81 MG EC tablet, Take 1 tablet by mouth daily. , Disp: , Rfl:    atorvastatin (LIPITOR) 80 MG tablet, Take 1 tablet by mouth daily., Disp: , Rfl:    loratadine (CLARITIN) 10 MG tablet, Take 10 mg by mouth daily. otc, Disp: , Rfl:    meloxicam  (MOBIC ) 15 MG tablet, Take 1 tablet (15 mg total) by mouth daily., Disp: 30 tablet, Rfl: 3   methylPREDNISolone  (MEDROL  DOSEPAK) 4 MG TBPK tablet, 6 day dose pack - take as directed, Disp: 21 tablet, Rfl: 0   metoprolol  succinate (TOPROL -XL) 50 MG 24 hr tablet, Take 1 tablet by mouth daily., Disp: , Rfl:    Multiple Vitamin (MULTIVITAMIN) capsule, Take 1 capsule by mouth daily., Disp: , Rfl:   Current  Facility-Administered Medications:    triamcinolone  acetonide (KENALOG -40) injection 20 mg, 20 mg, Other, Once,

## 2024-07-16 ENCOUNTER — Ambulatory Visit: Admitting: Family Medicine

## 2024-09-17 ENCOUNTER — Ambulatory Visit
